# Patient Record
Sex: Female | Born: 1945 | Race: White | Hispanic: No | Marital: Married | Smoking: Never smoker
Health system: Southern US, Community
[De-identification: ages and names within clinical notes are randomized; demographics above are authoritative.]

## PROBLEM LIST (undated history)

## (undated) DIAGNOSIS — T7840XA Allergy, unspecified, initial encounter: Secondary | ICD-10-CM

## (undated) DIAGNOSIS — N390 Urinary tract infection, site not specified: Secondary | ICD-10-CM

## (undated) DIAGNOSIS — M797 Fibromyalgia: Secondary | ICD-10-CM

## (undated) DIAGNOSIS — F32A Depression, unspecified: Secondary | ICD-10-CM

## (undated) DIAGNOSIS — F329 Major depressive disorder, single episode, unspecified: Secondary | ICD-10-CM

## (undated) DIAGNOSIS — R569 Unspecified convulsions: Secondary | ICD-10-CM

## (undated) HISTORY — DX: Allergy, unspecified, initial encounter: T78.40XA

## (undated) HISTORY — PX: OVARIAN CYST REMOVAL: SHX89

## (undated) HISTORY — DX: Depression, unspecified: F32.A

## (undated) HISTORY — DX: Unspecified convulsions: R56.9

## (undated) HISTORY — PX: KYPHOSIS SURGERY: SHX114

## (undated) HISTORY — PX: ABDOMINAL HYSTERECTOMY: SHX81

## (undated) HISTORY — DX: Urinary tract infection, site not specified: N39.0

## (undated) HISTORY — PX: TUBAL LIGATION: SHX77

## (undated) HISTORY — DX: Major depressive disorder, single episode, unspecified: F32.9

## (undated) HISTORY — PX: NASAL SEPTUM SURGERY: SHX37

## (undated) HISTORY — PX: UPPER GASTROINTESTINAL ENDOSCOPY: SHX188

## (undated) HISTORY — DX: Fibromyalgia: M79.7

---

## 1998-06-19 DIAGNOSIS — R569 Unspecified convulsions: Secondary | ICD-10-CM

## 1998-06-19 HISTORY — DX: Unspecified convulsions: R56.9

## 1999-06-20 ENCOUNTER — Encounter: Payer: Self-pay | Admitting: Family Medicine

## 1999-06-20 LAB — CONVERTED CEMR LAB

## 2001-05-15 ENCOUNTER — Encounter: Admission: RE | Admit: 2001-05-15 | Discharge: 2001-05-15 | Payer: Self-pay | Admitting: *Deleted

## 2001-05-20 ENCOUNTER — Encounter: Admission: RE | Admit: 2001-05-20 | Discharge: 2001-05-20 | Payer: Self-pay | Admitting: *Deleted

## 2003-06-01 ENCOUNTER — Ambulatory Visit (HOSPITAL_COMMUNITY): Admission: RE | Admit: 2003-06-01 | Discharge: 2003-06-01 | Payer: Self-pay | Admitting: Neurosurgery

## 2003-07-02 ENCOUNTER — Ambulatory Visit (HOSPITAL_COMMUNITY): Admission: RE | Admit: 2003-07-02 | Discharge: 2003-07-02 | Payer: Self-pay | Admitting: Neurosurgery

## 2003-07-09 ENCOUNTER — Inpatient Hospital Stay (HOSPITAL_COMMUNITY): Admission: RE | Admit: 2003-07-09 | Discharge: 2003-07-12 | Payer: Self-pay | Admitting: Neurosurgery

## 2003-07-09 ENCOUNTER — Encounter (INDEPENDENT_AMBULATORY_CARE_PROVIDER_SITE_OTHER): Payer: Self-pay | Admitting: Specialist

## 2003-08-13 ENCOUNTER — Ambulatory Visit (HOSPITAL_COMMUNITY): Admission: RE | Admit: 2003-08-13 | Discharge: 2003-08-13 | Payer: Self-pay | Admitting: Neurosurgery

## 2003-09-04 ENCOUNTER — Inpatient Hospital Stay (HOSPITAL_COMMUNITY): Admission: RE | Admit: 2003-09-04 | Discharge: 2003-09-05 | Payer: Self-pay | Admitting: Neurosurgery

## 2004-04-26 ENCOUNTER — Ambulatory Visit: Payer: Self-pay | Admitting: Family Medicine

## 2005-04-13 ENCOUNTER — Ambulatory Visit: Payer: Self-pay | Admitting: Family Medicine

## 2005-04-20 ENCOUNTER — Ambulatory Visit: Payer: Self-pay | Admitting: Family Medicine

## 2007-02-27 ENCOUNTER — Encounter: Payer: Self-pay | Admitting: Family Medicine

## 2007-02-27 DIAGNOSIS — F329 Major depressive disorder, single episode, unspecified: Secondary | ICD-10-CM

## 2007-02-27 DIAGNOSIS — N390 Urinary tract infection, site not specified: Secondary | ICD-10-CM | POA: Insufficient documentation

## 2007-02-27 DIAGNOSIS — Z8669 Personal history of other diseases of the nervous system and sense organs: Secondary | ICD-10-CM | POA: Insufficient documentation

## 2007-02-27 DIAGNOSIS — IMO0001 Reserved for inherently not codable concepts without codable children: Secondary | ICD-10-CM | POA: Insufficient documentation

## 2007-02-27 DIAGNOSIS — G43909 Migraine, unspecified, not intractable, without status migrainosus: Secondary | ICD-10-CM | POA: Insufficient documentation

## 2009-07-15 ENCOUNTER — Encounter: Admission: RE | Admit: 2009-07-15 | Discharge: 2009-07-15 | Payer: Self-pay | Admitting: Anesthesiology

## 2009-07-21 ENCOUNTER — Encounter: Admission: RE | Admit: 2009-07-21 | Discharge: 2009-07-21 | Payer: Self-pay | Admitting: Anesthesiology

## 2010-11-04 NOTE — Discharge Summary (Signed)
NAME:  Amy Vargas, KINSEL                        ACCOUNT NO.:  [id number]   MEDICAL RECORD NO.:  [id number]                   PATIENT TYPE:  INP   LOCATION:  3009                                 FACILITY:  [facility]   PHYSICIAN:  Hewitt Shorts, M.D.            DATE OF BIRTH:  Feb 01, 1946   DATE OF ADMISSION:  07/09/2003  DATE OF DISCHARGE:  07/12/2003                                 DISCHARGE SUMMARY   HISTORY OF PRESENT ILLNESS:  The patient is a 65 year old woman patient of  Dr. Fredrich Birks, whom he evaluated for multiple compression fractures at T11,  T12, and L4.  He elected to admit the patient for kyphoplasty, and details  of her history and admission physical examination are included in Dr.  Fredrich Birks admission note.   HOSPITAL COURSE:  The patient was admitted, underwent the T11, T12, and L4  kyphoplasty with bone biopsy.  Following surgery, she did well other than  for urinary retention.  She required in-and-out catheterizations for several  days; however, she began to void on her own.  We did check a postvoid  residual which was 50 mL.  She also had difficulties with migraines that she  treated with her usual medications, including Soma and Wellbutrin and, at  this time, she is asking to be discharged to home.  She is afebrile.  She is  to return to see Dr. Venetia Maxon in 2 weeks with x-rays of her back to be done  that day.  She was given a prescription for Percocet 1-2 tabs p.o. q.4-6h.  p.r.n. pain, 40 tabs prescribed, no refills.   DISCHARGE DIAGNOSES:  T11, T12, and L4 pathologic fractures with intractable  back pain.                                                Hewitt Shorts, M.D.    RWN/MEDQ  D:  07/12/2003  T:  07/12/2003  Job:  [id number]

## 2010-11-04 NOTE — Op Note (Signed)
NAME:  Amy Vargas, Amy Vargas                        ACCOUNT NO.:  [id number]   MEDICAL RECORD NO.:  [id number]                   PATIENT TYPE:  INP   LOCATION:  3039                                 FACILITY:  [facility]   PHYSICIAN:  Danae Orleans. Venetia Maxon, M.D.               DATE OF BIRTH:  1946-04-28   DATE OF PROCEDURE:  09/04/2003  DATE OF DISCHARGE:  09/05/2003                                 OPERATIVE REPORT   PREOPERATIVE DIAGNOSIS:  L1 compression fracture with osteoporosis.   POSTOPERATIVE DIAGNOSIS:  L1 compression fracture with osteoporosis.   PROCEDURE:  L1 kyphoplasty with fluoroscopic interpretation.   SURGEON:  Danae Orleans. Venetia Maxon, M.D.   ANESTHESIA:  General endotracheal anesthesia.   ESTIMATED BLOOD LOSS:  Minimal.   COMPLICATIONS:  None.   DISPOSITION:  To recovery.   INDICATIONS:  [name] is a 65 year old woman with osteoporosis with  multiple vertebral compression fractures.  She had previous kyphoplasty of  T11 and T12 fractures and L4 fracture.  She then fell on some grease and  landed on her buttock and has a new compression fracture of L1, and this is  exquisitely painful to her.  It was elected to take her to surgery for  kyphoplasty of the L1 fracture.   DESCRIPTION OF PROCEDURE:  Ms. Poss was brought to the operating room.  Following the satisfactory and uncomplicated induction of general  endotracheal anesthesia and placement of intravenous lines, the patient was  turned into a prone position on chest rolls.  Her back was then prepped and  draped in the usual sterile fashion.  AP and lateral fluoroscopy were  positioned over the L1 vertebral body on the left side of the midline  approximately 1.5 cm off the lateral edge of the pedicle.  The subcutaneous  tissues were infiltrated with 0.25% Marcaine and 0.5% lidocaine with  1:200,000 epinephrine.  Incision was made in this location and a one-step  introducer was then inserted into the lateral edge of the L1  pedicle and  driven in a transpedicular approach toward the anterior aspect of the L1  vertebra in the midline.  After confirmation of positioning on the AP and  lateral fluoroscopy, the kyphoplasty balloon was inserted and using the  inflatable bone tamp, pressures were approximately 180 at the most,  typically about 130.  There was good restoration of vertebral body height  and restoration of superior end plate fracture.  No cut-outs or  extravasation of dye.  The bone cement was then mixed and the balloon was  deflated.  The cavity was then filled with 4.5 mL of Kyphon bone cement with  good interdigitation, two-thirds body fill with mediolateral and anterior to  posterior fill.  It was felt that this was adequate filling.  The introducer  was then removed.  A single stitch  was placed and the wound was dressed with Dermabond.  The patient was  extubated in  the operating room and taken to the recovery room in stable and  satisfactory condition, having tolerated her operation well.  Counts were  correct at the end of the case.                                               Danae Orleans. Venetia Maxon, M.D.    JDS/MEDQ  D:  09/04/2003  T:  09/07/2003  Job:  [id number]

## 2010-11-04 NOTE — Op Note (Signed)
NAME:  Amy Vargas, Amy Vargas                        ACCOUNT NO.:  [id number]   MEDICAL RECORD NO.:  [id number]                   PATIENT TYPE:  INP   LOCATION:  3009                                 FACILITY:  [facility]   PHYSICIAN:  Danae Orleans. Venetia Maxon, M.D.               DATE OF BIRTH:  1945/08/03   DATE OF PROCEDURE:  07/09/2003  DATE OF DISCHARGE:                                 OPERATIVE REPORT   PREOPERATIVE DIAGNOSIS:  T11, T12, and L4 pathologic fractures with severe  intractable back pain.   POSTOPERATIVE DIAGNOSIS:  T11, T12, and L4 pathologic fractures with severe intractable back pain.   PROCEDURE:  T11, T12, and L4 kyphoplasty with open reduction and internal  fixation of pathologic fractures with inflatable bone taps and methyl  methacrylate bone cement with bone biopsy at each level.   SURGEON:  Danae Orleans. Venetia Maxon, M.D.   ANESTHESIA:  General endotracheal anesthesia.   ESTIMATED BLOOD LOSS:  Minimal.   COMPLICATIONS:  None.   DISPOSITION:  Recovery room.   INDICATIONS FOR PROCEDURE:  [name] is a 65 year old woman with  severe, intractable pain following an L4 fracture.  She was scheduled for an  L4 kyphoplasty and then fell again and fractured T11 and T12 levels and is  having severe pain at these levels, as well.  An MRI demonstrated high  signal in the T11 and T12 vertebra and it was elected to include these at  the time of surgical procedure.   PROCEDURE:  Ms. Millirons was brought to the operating room.  Following  satisfactory uncomplicated induction of general endotracheal anesthesia and  placement of intravenous  lines, the patient was turned in a prone position  on chest and iliac crest rolls.  Her AP and lateral fluoroscopy was  established.  Using the kyphoplasty one step introducer system, inflatable  bone taps were placed at the T11, T12, and L4 levels.  There was good  restoration of vertebral body height.  No violations of the lateral cortex  with  balloon inflation and bone biopsies were obtained at each level.  The  patient does not have a history of osteoporosis but had her last bone  density study in 1996.  The bone cement was then mixed appropriately and  after it had arrived at the appropriate consistency, it was placed in the  fracture level to introducers of bone cement on each side at T11 and T12 and  also at the L4 level which was operated on separately.  Initially, the T11  and T12 levels were operated and then C-arms were moved to L4 and this was  operated on after that.  There was good restoration of vertebral body  height.  No evidence of violation or extravasation of bone cement other than  a very small punctate area at the superior aspect of the L4 endplate.  The  introducers were then removed.  3-0 Vicryl sutures were  placed at each of  the separate stab incisions and Dermabond was placed for wound dressing.  The patient tolerated the procedure well.  Counts were correct at the end of  the case.                                               Danae Orleans. Venetia Maxon, M.D.    JDS/MEDQ  D:  07/09/2003  T:  07/09/2003  Job:  [id number]

## 2010-12-27 ENCOUNTER — Encounter: Payer: Self-pay | Admitting: Family Medicine

## 2011-04-28 ENCOUNTER — Encounter: Payer: Self-pay | Admitting: Family Medicine

## 2011-05-01 ENCOUNTER — Ambulatory Visit (INDEPENDENT_AMBULATORY_CARE_PROVIDER_SITE_OTHER): Payer: Medicare Other | Admitting: Family Medicine

## 2011-05-01 ENCOUNTER — Encounter: Payer: Self-pay | Admitting: Family Medicine

## 2011-05-01 DIAGNOSIS — F329 Major depressive disorder, single episode, unspecified: Secondary | ICD-10-CM

## 2011-05-01 DIAGNOSIS — M810 Age-related osteoporosis without current pathological fracture: Secondary | ICD-10-CM | POA: Insufficient documentation

## 2011-05-01 DIAGNOSIS — F3289 Other specified depressive episodes: Secondary | ICD-10-CM

## 2011-05-01 DIAGNOSIS — Z1211 Encounter for screening for malignant neoplasm of colon: Secondary | ICD-10-CM

## 2011-05-01 DIAGNOSIS — Z79899 Other long term (current) drug therapy: Secondary | ICD-10-CM

## 2011-05-01 DIAGNOSIS — IMO0001 Reserved for inherently not codable concepts without codable children: Secondary | ICD-10-CM

## 2011-05-01 DIAGNOSIS — Z0000 Encounter for general adult medical examination without abnormal findings: Secondary | ICD-10-CM

## 2011-05-01 DIAGNOSIS — R569 Unspecified convulsions: Secondary | ICD-10-CM

## 2011-05-01 NOTE — Patient Instructions (Signed)
Schedule your physical at your convenience- you can eat since we'll have the labs done Call and ask your insurance about the shingles shot We'll do the Pneumonia shot at the next visit We'll notify you of your lab results Someone will call with your bone density and GI referrals Call with any questions or concerns Welcome!  We're glad to have you!!!

## 2011-05-01 NOTE — Progress Notes (Signed)
  Subjective:    Patient ID: Amy Vargas, female    DOB: 1946-01-01, 65 y.o.   MRN: [id number]  HPI New to establish.  Previous MD- Scotty Court.  Seizure Disorder- chronic problem, has been seizure free 'for a long long time'.  Not currently on meds.  No longer following w/ neuro.  Fibromyalgia- chronic problem, sees Dr Vear Clock (Pain Management)  Osteoporosis- chronic problem for pt, was previously on Forteo.  Has multiple vertebral fractures, s/p kyphoplasty x2.  Not currently on any meds.  Has been taking 3 Calcium pills daily.  Tried Fosamax but this caused GI problems.  Has never heard of Reclast.  Depression- chronic problem, on Wellbutrin, take 2 tabs twice daily.  Depression is related to physical pain.  Has previously done counseling.  Denies SI/HI.  Not crying frequently, feels like sxs are well controlled.  Health maintenance- UTD on mammo, had colonoscopy done in 2001- overdue.  Due for labs, DEXA, colonoscopy.  Review of Systems For ROS see HPI     Objective:   Physical Exam  Vitals reviewed. Constitutional: She is oriented to person, place, and time. She appears well-developed and well-nourished. No distress.  HENT:  Head: Normocephalic and atraumatic.  Eyes: Conjunctivae and EOM are normal. Pupils are equal, round, and reactive to light.  Neck: Normal range of motion. Neck supple. No thyromegaly present.  Cardiovascular: Normal rate, regular rhythm, normal heart sounds and intact distal pulses.   No murmur heard. Pulmonary/Chest: Effort normal and breath sounds normal. No respiratory distress.  Abdominal: Soft. She exhibits no distension. There is no tenderness.  Musculoskeletal: She exhibits no edema.  Lymphadenopathy:    She has no cervical adenopathy.  Neurological: She is alert and oriented to person, place, and time.  Skin: Skin is warm and dry.  Psychiatric: She has a normal mood and affect. Her behavior is normal.          Assessment & Plan:

## 2011-05-02 LAB — CBC WITH DIFFERENTIAL/PLATELET
Eosinophils Absolute: 0.1 10*3/uL (ref 0.0–0.7)
Lymphocytes Relative: 13.3 % (ref 12.0–46.0)
MCHC: 33.4 g/dL (ref 30.0–36.0)
MCV: 88.3 fl (ref 78.0–100.0)
Monocytes Absolute: 0.1 10*3/uL (ref 0.1–1.0)
Neutrophils Relative %: 83.2 % — ABNORMAL HIGH (ref 43.0–77.0)
Platelets: 266 10*3/uL (ref 150.0–400.0)
WBC: 7 10*3/uL (ref 4.5–10.5)

## 2011-05-02 LAB — HEPATIC FUNCTION PANEL
AST: 22 U/L (ref 0–37)
Alkaline Phosphatase: 61 U/L (ref 39–117)
Bilirubin, Direct: 0.1 mg/dL (ref 0.0–0.3)
Total Bilirubin: 0.5 mg/dL (ref 0.3–1.2)

## 2011-05-02 LAB — BASIC METABOLIC PANEL
GFR: 59.06 mL/min — ABNORMAL LOW (ref >60.00)
Potassium: 4 mEq/L (ref 3.5–5.1)
Sodium: 143 mEq/L (ref 135–145)

## 2011-05-02 LAB — LIPID PANEL
HDL: 82.9 mg/dL (ref >39.00)
Total CHOL/HDL Ratio: 3
VLDL: 15.6 mg/dL (ref 0.0–40.0)

## 2011-05-03 ENCOUNTER — Encounter: Payer: Self-pay | Admitting: *Deleted

## 2011-05-03 ENCOUNTER — Encounter: Payer: Self-pay | Admitting: Gastroenterology

## 2011-05-04 LAB — VITAMIN D 1,25 DIHYDROXY
Vitamin D 1, 25 (OH)2 Total: 51 pg/mL (ref 18–72)
Vitamin D3 1, 25 (OH)2: 51 pg/mL

## 2011-05-05 ENCOUNTER — Encounter: Payer: Self-pay | Admitting: *Deleted

## 2011-05-07 ENCOUNTER — Encounter: Payer: Self-pay | Admitting: Family Medicine

## 2011-05-07 NOTE — Assessment & Plan Note (Signed)
Chronic problem.  Follows w/ pain management.  On chronic narcotics.  Will not prescribe additional controlled substances due to contract.

## 2011-05-07 NOTE — Assessment & Plan Note (Signed)
Chronic problem for pt.  Previously on Forteo.  Not currently.  Hx of multiple vertebral fxs.  Overdue on DEXA.  May be good candidate for Reclast.

## 2011-05-07 NOTE — Assessment & Plan Note (Signed)
Pt has hx of seziures but has been seizure free 'for years' w/out medication.  No longer seeing neuro.  Will follow.

## 2011-05-07 NOTE — Assessment & Plan Note (Signed)
Chronic problem for pt.  Feels sxs are well controlled on Wellbutrin.  Will continue to follow.

## 2011-05-08 ENCOUNTER — Telehealth: Payer: Self-pay | Admitting: *Deleted

## 2011-05-08 NOTE — Telephone Encounter (Signed)
Manually faxed benefit verification form to reclast at [phone]

## 2011-05-19 ENCOUNTER — Encounter: Payer: Self-pay | Admitting: Gastroenterology

## 2011-05-19 ENCOUNTER — Ambulatory Visit (AMBULATORY_SURGERY_CENTER): Payer: Medicare Other | Admitting: *Deleted

## 2011-05-19 ENCOUNTER — Telehealth: Payer: Self-pay | Admitting: *Deleted

## 2011-05-19 VITALS — Ht 63.0 in | Wt 119.5 lb

## 2011-05-19 DIAGNOSIS — Z1211 Encounter for screening for malignant neoplasm of colon: Secondary | ICD-10-CM

## 2011-05-19 MED ORDER — PEG-KCL-NACL-NASULF-NA ASC-C 100 G PO SOLR: ORAL | Status: DC

## 2011-05-19 NOTE — Telephone Encounter (Signed)
Called [employer] to ask about the verifaction for this pt per had called on 05-10-11 and was advised that the fax was never received. Per originally faxed on 05-08-11. Received information about another pt that was faxed same day. Called and spoke to another rep for reclast to ask for update. Was advised that we received a fax on 05-17-11 to state the pt dx code was not check on app. Did not find a fax with this information. Did not the dx code was left off form. Did check code 733.01 and refaxed on 05-19-11

## 2011-05-23 ENCOUNTER — Telehealth: Payer: Self-pay | Admitting: *Deleted

## 2011-05-23 NOTE — Telephone Encounter (Signed)
Called [employer] to confirm they are working on reclast for pt per last fax/phone call stated the dx code was missing from pt form Rep advised that the dx code fax has been received and pt is currently being verified by their company for reclast reclast rep advised that when the verifcation comes through we will be faxed information of benefits. Confirmed our fax number with rep

## 2011-06-02 ENCOUNTER — Ambulatory Visit (AMBULATORY_SURGERY_CENTER): Payer: Medicare Other | Admitting: Gastroenterology

## 2011-06-02 ENCOUNTER — Encounter: Payer: Self-pay | Admitting: Gastroenterology

## 2011-06-02 VITALS — BP 134/82 | HR 98 | Temp 98.0°F | Resp 16 | Ht 63.0 in | Wt 119.0 lb

## 2011-06-02 DIAGNOSIS — D126 Benign neoplasm of colon, unspecified: Secondary | ICD-10-CM

## 2011-06-02 DIAGNOSIS — Z1211 Encounter for screening for malignant neoplasm of colon: Secondary | ICD-10-CM

## 2011-06-02 MED ORDER — SODIUM CHLORIDE 0.9 % IV SOLN: 500.0000 mL | INTRAVENOUS | Status: DC

## 2011-06-02 NOTE — Patient Instructions (Signed)
Please follow all discharge instructions given to you by the recovery room nurse. If you have any questions or problems after discharge please call [phone]. You will receive a phone call in the am to see how you are doing and answer any questions you may have. Thank you for choosing [facility] for your health care needs.  

## 2011-06-02 NOTE — Progress Notes (Signed)
Patient did not experience any of the following events: a burn prior to discharge; a fall within the facility; wrong site/side/patient/procedure/implant event; or a hospital transfer or hospital admission upon discharge from the facility. (G8907) Patient did not have preoperative order for IV antibiotic SSI prophylaxis. (G8918)  

## 2011-06-02 NOTE — Op Note (Signed)
[facility] [address] [place], [place]  [address]  COLONOSCOPY PROCEDURE REPORT  PATIENT:  Amy, Vargas  MR#:  [id number] BIRTHDATE:  January 06, 1946, 65 yrs. old  GENDER:  female ENDOSCOPIST:  Rachael Fee, MD REF. BY:  Neena Rhymes, M.D. PROCEDURE DATE:  06/02/2011 PROCEDURE:  Colonoscopy with snare polypectomy ASA CLASS:  Class II INDICATIONS:  Routine Risk Screening MEDICATIONS:   Fentanyl 50 mcg IV, Benadryl 12.5 mg IV, These medications were titrated to patient response per physician's verbal order, Versed 8 mg IV  DESCRIPTION OF PROCEDURE:   After the risks benefits and alternatives of the procedure were thoroughly explained, informed consent was obtained.  Digital rectal exam was performed and revealed no rectal masses.   The LB [id number]-[id number] endoscope was introduced through the anus and advanced to the cecum, which was identified by both the appendix and ileocecal valve, without limitations.  The quality of the prep was adequate..  The instrument was then slowly withdrawn as the colon was fully examined.<<PROCEDUREIMAGES>> FINDINGS:  Three polyps were found. All were removed and all sent to pathology. Two were sessile, 3 to 7mm across, removed with cold snare, ascending and trasnverse segments, path jar 1. One was raised, soft, 1.9cm across, removed completely (without piecemeal) from hepatic flexure with snare/cautery, path jar 2 (see image5, image6, image7, and image12).  This was otherwise a normal examination of the colon (see image13, image3, and image2). Retroflexed views in the rectum revealed no abnormalities. COMPLICATIONS:  None  ENDOSCOPIC IMPRESSION: 1) Three polyps (one was 1.9cm), all were removed and all sent to pathology 2) Otherwise normal examination  RECOMMENDATIONS: 1) If the polyps removed today are proven to be adenomatous (pre-cancerous) polyps, you will need a colonoscopy in 3 years. Otherwise you should continue to  follow colorectal cancer screening guidelines for "routine risk" patients with a colonoscopy in 10 years. 2) You will receive a letter within 1-2 weeks with the results of your biopsy as well as final recommendations. Please call my office if you have not received a letter after 3 weeks.  ______________________________ Rachael Fee, MD  n. eSIGNED:   Rachael Fee at 06/02/2011 11:09 AM  Myriam Forehand, [phone]

## 2011-06-05 ENCOUNTER — Telehealth: Payer: Self-pay | Admitting: *Deleted

## 2011-06-05 NOTE — Telephone Encounter (Signed)

## 2011-06-14 ENCOUNTER — Telehealth: Payer: Self-pay | Admitting: *Deleted

## 2011-06-14 NOTE — Telephone Encounter (Signed)
Called pt to advise reclast benefits of a 20% co-pay pt not sure how much this will be and wants to call insurance company to see the exact amount and will call us back if and when she wants to do the reclast, advised if I do not hear from her I will call her back to check in on the progress, pt agreed

## 2011-06-20 HISTORY — PX: EYE SURGERY: SHX253

## 2011-06-21 ENCOUNTER — Telehealth: Payer: Self-pay | Admitting: *Deleted

## 2011-06-21 ENCOUNTER — Ambulatory Visit (INDEPENDENT_AMBULATORY_CARE_PROVIDER_SITE_OTHER): Payer: Medicare Other | Admitting: Family Medicine

## 2011-06-21 ENCOUNTER — Encounter: Payer: Self-pay | Admitting: Family Medicine

## 2011-06-21 DIAGNOSIS — Z0000 Encounter for general adult medical examination without abnormal findings: Secondary | ICD-10-CM

## 2011-06-21 DIAGNOSIS — M810 Age-related osteoporosis without current pathological fracture: Secondary | ICD-10-CM

## 2011-06-21 DIAGNOSIS — Z23 Encounter for immunization: Secondary | ICD-10-CM

## 2011-06-21 MED ORDER — ZOSTER VACCINE LIVE 19400 UNT/0.65ML ~~LOC~~ SOLR: 0.6500 mL | Freq: Once | SUBCUTANEOUS | Status: AC

## 2011-06-21 NOTE — Telephone Encounter (Signed)
Pt came in for OV and advised that hospital nor insurance company can tell her how much her reclast will cost her out of pocket, pt unable to get an answer and does want the reclast, I contacted the [employer] Stay and was advised that they only set up the appts and was given several numbers to contact, called admitting and financial dept and was advised no answer could be given in this dept called pt accounting and noted a vm option only, left vm to call office about pt question. MD aware verbally

## 2011-06-21 NOTE — Progress Notes (Signed)
  Subjective:    Patient ID: Amy Vargas, female    DOB: [birth year], 66 y.o.   MRN: [id number]  HPI Here today for CPE.  Risk Factors: Osteoporosis- is attempting to determine insurance coverage on Reclast.  Physical Activity: walking regularly Fall Risk: is unsteady on feet due to multiple pain meds Depression: chronic problem, well controlled on current meds. Hearing: normal to conversational tones, somewhat decreased to whispered voice ADL's: independent Cognitive: normal linear thought process, memory and attention intact Home Safety: safe at home Height, Weight, BMI, Visual Acuity: see vitals, vision corrected to 20/20 w/ glasses Counseling: UTD on colonoscopy, mammo, no need for pap due to TAH-BSO.  Due for shingles and pneumovax Labs Ordered: See A&P Care Plan: See A&P    Review of Systems Patient reports no vision/ hearing changes, adenopathy,fever, weight change,  persistant/recurrent hoarseness , swallowing issues, chest pain, palpitations, edema, persistant/recurrent cough, hemoptysis, dyspnea (rest/exertional/paroxysmal nocturnal), gastrointestinal bleeding (melena, rectal bleeding), abdominal pain, significant heartburn, bowel changes, GU symptoms (dysuria, hematuria, incontinence), Gyn symptoms (abnormal  bleeding, pain),  syncope, focal weakness, memory loss, skin/hair/nail changes, abnormal bruising or bleeding.   Intermittent tingling of feet, + depression, intermittent N/V    Objective:   Physical Exam General Appearance:    Alert, cooperative, no distress, appears stated age  Head:    Normocephalic, without obvious abnormality, atraumatic  Eyes:    PERRL, conjunctiva/corneas clear, EOM's intact, fundi    benign, both eyes  Ears:    Normal TM's and external ear canals, both ears  Nose:   Nares normal, septum midline, mucosa normal, no drainage    or sinus tenderness  Throat:   Lips, mucosa, and tongue normal; teeth and gums normal  Neck:   Supple,  symmetrical, trachea midline, no adenopathy;    Thyroid: no enlargement/tenderness/nodules  Back:     Symmetric, no curvature, ROM normal, no CVA tenderness  Lungs:     Clear to auscultation bilaterally, respirations unlabored  Chest Wall:    No tenderness or deformity   Heart:    Regular rate and rhythm, S1 and S2 normal, no murmur, rub   or gallop  Breast Exam:    Deferred at pt's request  Abdomen:     Soft, non-tender, bowel sounds active all four quadrants,    no masses, no organomegaly  Genitalia:    Deferred at pt's request  Rectal:    Extremities:   Extremities normal, atraumatic, no cyanosis or edema  Pulses:   2+ and symmetric all extremities  Skin:   Skin color, texture, turgor normal, no rashes or lesions  Lymph nodes:   Cervical, supraclavicular, and axillary nodes normal  Neurologic:   CNII-XII intact, normal strength, sensation and reflexes    throughout          Assessment & Plan:

## 2011-06-21 NOTE — Patient Instructions (Signed)
Follow up in 1 year or as needed Go get your shingles shot at the pharmacy Call your insurance company and tell them that you would have your Reclast infusion at [facility] and you need to know the price Your labs look great! Call with any questions or concerns Happy New Year!

## 2011-07-02 NOTE — Assessment & Plan Note (Signed)
Pt's PE WNL.  UTD on health maintenance.  Reviewed labs from last visit.  EKG done- see document for interpretation.  Anticipatory guidance provided.

## 2011-07-02 NOTE — Assessment & Plan Note (Signed)
Pt is trying to determine insurance coverage for Reclast prior to proceeding.  Encouraged her to call and ask for price.  Pt to keep me updated on when she would like to have infusion.

## 2011-09-20 ENCOUNTER — Telehealth: Payer: Self-pay | Admitting: *Deleted

## 2011-09-20 NOTE — Telephone Encounter (Signed)
Called pt to advise results of bone density scan results as Osteoporosis, pt needs to start Fosamax 75mg  weekly, and continue taking the ca+ and Vit d, pt advised that she can not take oral medication per this upsets her stomach, pt also noted that she can not afford the reclast per her insurance will no longer pay for it, pt notes unable to pay for it per her money is tight per her sister died 03/09/2024apologized for the passing of her sister, noted that I will tell MD Beverely Low to see if any other alternatives can be advised, pt understood

## 2011-09-20 NOTE — Telephone Encounter (Signed)
Reclast is our best option- this is typically covered at 80% by Medicare but I can't tell her how much her payment would be.  At this time, it would be best to ensure 1200 Ca and 800 Vit D daily.  i'm sorry for the loss of her sister.

## 2011-09-20 NOTE — Telephone Encounter (Signed)
Noted waiver in pt chart signed to allow detailed messages to be left on voicemail, left detailed message about: results/instructions/prescribtion information. Advise if any further concerns or questions please call our office at [phone]. Also advised MD Beverely Low sorry about the loss of her sister

## 2011-09-26 ENCOUNTER — Encounter: Payer: Self-pay | Admitting: Family Medicine

## 2011-10-31 ENCOUNTER — Telehealth: Payer: Self-pay | Admitting: *Deleted

## 2011-10-31 NOTE — Telephone Encounter (Signed)
Called pt to update about her reclast per pt and this nurse noted inability to find out pt out of pocket cost, noted that a similar pt came into office with the same insurance and was given an exact amount of pay out, advised to call her insurance to see if they have an update about her personal amount out of pocket, pt understand and will contact insurance company to see if they can now give her an out of pocket amount. MD Beverely Low made aware verbally

## 2011-11-24 ENCOUNTER — Ambulatory Visit (INDEPENDENT_AMBULATORY_CARE_PROVIDER_SITE_OTHER): Payer: Medicare Other | Admitting: Family Medicine

## 2011-11-24 ENCOUNTER — Encounter: Payer: Self-pay | Admitting: Family Medicine

## 2011-11-24 VITALS — BP 109/72 | HR 100 | Temp 98.3°F | Ht 63.5 in | Wt 109.0 lb

## 2011-11-24 DIAGNOSIS — R634 Abnormal weight loss: Secondary | ICD-10-CM

## 2011-11-24 DIAGNOSIS — J329 Chronic sinusitis, unspecified: Secondary | ICD-10-CM

## 2011-11-24 MED ORDER — AMOXICILLIN 500 MG PO TABS: 500.0000 mg | ORAL_TABLET | Freq: Two times a day (BID) | ORAL | Status: AC

## 2011-11-24 NOTE — Patient Instructions (Addendum)
Follow up in 1 month to recheck weight This is a sinus infection Start the Amoxicillin twice daily- take w/ food Make sure you are eating regularly If not interested in eating, make sure you drink Boost or Ensure to get some protein Call with any questions or concerns Hang in there!

## 2011-11-24 NOTE — Progress Notes (Signed)
  Subjective:    Patient ID: Amy Vargas, female    DOB: 30-Apr-1946, 66 y.o.   MRN: [id number]  HPI Sore throat- reports throat is swollen, very sore.  sxs started 1 week ago.  Intermittent fevers and chills.  Tm 101.  + nasal congestion.  No facial pain, ear pain.  + nausea.  No vomiting.  No known sick contacts.  No rash.  Hx of seasonal allergies.  No cough.  + hoarseness.  Has lost 13 lbs since last visit- 'everything tastes like cardboard'.  Since getting sick has been drinking fluids and eating liquid diet.   Review of Systems For ROS see HPI     Objective:   Physical Exam  Vitals reviewed. Constitutional: She is oriented to person, place, and time. No distress.       Very thin, frail appearing  HENT:  Head: Normocephalic and atraumatic.  Right Ear: Tympanic membrane normal.  Left Ear: Tympanic membrane normal.  Nose: Mucosal edema and rhinorrhea present. Right sinus exhibits maxillary sinus tenderness and frontal sinus tenderness. Left sinus exhibits maxillary sinus tenderness and frontal sinus tenderness.  Mouth/Throat: Uvula is midline and mucous membranes are normal. Posterior oropharyngeal erythema present. No oropharyngeal exudate.  Eyes: Conjunctivae and EOM are normal. Pupils are equal, round, and reactive to light.  Neck: Normal range of motion. Neck supple.  Cardiovascular: Normal rate, regular rhythm and normal heart sounds.   Pulmonary/Chest: Effort normal and breath sounds normal. No respiratory distress. She has no wheezes.  Abdominal: Soft. Bowel sounds are normal. She exhibits no distension. There is no tenderness. There is no rebound.  Lymphadenopathy:    She has no cervical adenopathy.  Neurological: She is alert and oriented to person, place, and time.  Skin: Skin is warm and dry.  Psychiatric:       Flat affect, withdrawn          Assessment & Plan:

## 2011-12-10 DIAGNOSIS — R634 Abnormal weight loss: Secondary | ICD-10-CM | POA: Insufficient documentation

## 2011-12-10 NOTE — Assessment & Plan Note (Signed)
New.  Pt has lost 13 lbs unintentionally since last visit.  Is flat, withdrawn.  Questioned her if depression was worse- pt 'can't say'.  Encouraged her to add high calorie protein supplement to current 'liquid diet'.  Will follow closely.

## 2011-12-10 NOTE — Assessment & Plan Note (Signed)
New.  Pt's sxs and PE consistent w/ infxn.  Start abx.  Reviewed supportive care and red flags that should prompt return.  Pt expressed understanding and is in agreement w/ plan.  

## 2012-04-16 ENCOUNTER — Telehealth: Payer: Self-pay | Admitting: *Deleted

## 2012-04-16 NOTE — Telephone Encounter (Signed)
Noted.  Thank you for reaching out to pt- will see her when she is able to return

## 2012-04-16 NOTE — Telephone Encounter (Signed)
Called pt to follow up from 11-24-11 OV, pt was in for multiple concerns with fatigue and weight loss noted, pt was to come back in one month for a follow up and pt has not been seen in office since that OV, pt stated she has not made an apt per new concerns in her life, noted her grandson recently moved in with her and she is afraid to make an apt at this time for fear of having to change apt per new living arrangements, pt also noted billing issue with insurance concerning colonoscopy and another test from last CPE, noted she has spoken with billing dept and insurance about the issue and they are currently working on a resolve, pt advised that she has noted fluctuation in her weight but nothing that is concerning to her at this time however plans to call and schedule an apt asap, pt noted that she did finally decide not to go through with the reclast per all the run around with the insurance and too many other billing issues with testing the insurance will not pay, please note

## 2012-06-05 ENCOUNTER — Ambulatory Visit (INDEPENDENT_AMBULATORY_CARE_PROVIDER_SITE_OTHER): Payer: Medicare Other | Admitting: Family Medicine

## 2012-06-05 ENCOUNTER — Encounter: Payer: Self-pay | Admitting: Family Medicine

## 2012-06-05 VITALS — BP 100/58 | HR 94 | Temp 97.3°F | Ht 63.5 in | Wt 97.6 lb

## 2012-06-05 DIAGNOSIS — E872 Acidosis: Secondary | ICD-10-CM

## 2012-06-05 DIAGNOSIS — E876 Hypokalemia: Secondary | ICD-10-CM

## 2012-06-05 LAB — BASIC METABOLIC PANEL
CO2: 30 mEq/L (ref 19–32)
Chloride: 100 mEq/L (ref 96–112)
Glucose, Bld: 84 mg/dL (ref 70–99)
Sodium: 139 mEq/L (ref 135–145)

## 2012-06-05 NOTE — Progress Notes (Signed)
  Subjective:    Patient ID: Amy Vargas, female    DOB: 11/17/1945, 66 y.o.   MRN: [id number]  HPI Hospital f/u- pt reports she ate a handful of tobasco potato chips and w/in hours was unable to hold anything down.  Severe abdominal pain, N/V, 911 was called.  Went to [facility].  Was admitted for 2-3 days.  Was started on Protonix in the hospital but fears this due to current osteoporosis.  Currently on Ranitidine 150mg  BID w/ good sxs control.  Pt's d/c summary indicated metabolic acidosis due to narcotic use.  Pt indicates she did not take more than usual- currently seeing pain management.  Pt reports feeling well now.  Denies N/V/D currently.  No abd pain, fevers, chills.   Review of Systems For ROS see HPI     Objective:   Physical Exam  Vitals reviewed. Constitutional: She is oriented to person, place, and time. No distress.       Pt very thin, frail appearing  HENT:  Head: Normocephalic and atraumatic.  Eyes: Conjunctivae normal and EOM are normal. Pupils are equal, round, and reactive to light.  Neck: Normal range of motion. Neck supple. No thyromegaly present.  Cardiovascular: Normal rate, regular rhythm, normal heart sounds and intact distal pulses.   Pulmonary/Chest: Effort normal and breath sounds normal. No respiratory distress. She has no wheezes. She has no rales.  Abdominal: Soft. Bowel sounds are normal. She exhibits no distension. There is no tenderness. There is no rebound and no guarding.  Musculoskeletal: She exhibits no edema.  Lymphadenopathy:    She has no cervical adenopathy.  Neurological: She is alert and oriented to person, place, and time.  Skin: Skin is warm and dry.  Psychiatric: She has a normal mood and affect. Her behavior is normal.          Assessment & Plan:

## 2012-06-05 NOTE — Patient Instructions (Addendum)
Schedule your complete physical at your convenience We'll notify you of your lab results No need for the Protonix Call with any questions or concerns Happy Holidays!!!

## 2012-06-13 ENCOUNTER — Encounter: Payer: Self-pay | Admitting: *Deleted

## 2012-06-16 NOTE — Assessment & Plan Note (Signed)
New.  There is some discrepancy between hospital d/c summary and pt's recollection of events.  D/c summary indicated unresponsiveness due to narcotic OD- pt reports this was not the case.  Pt denies taking meds any differently than usual or in excess of what's prescribed by pain management.  Pt well appearing today, check labs.  Encouraged her to discuss recent episode w/ pain management and determine whether meds need to be adjusted.

## 2012-06-16 NOTE — Assessment & Plan Note (Signed)
New.  Likely due to pt's recent vomiting and diarrhea.  Repeat labs today to make sure level has normalized.

## 2014-04-30 ENCOUNTER — Ambulatory Visit (INDEPENDENT_AMBULATORY_CARE_PROVIDER_SITE_OTHER): Payer: Medicare Other | Admitting: Family Medicine

## 2014-04-30 ENCOUNTER — Encounter: Payer: Self-pay | Admitting: Family Medicine

## 2014-04-30 VITALS — BP 104/87 | HR 98 | Temp 98.1°F | Resp 16 | Ht 63.0 in | Wt 120.1 lb

## 2014-04-30 DIAGNOSIS — M810 Age-related osteoporosis without current pathological fracture: Secondary | ICD-10-CM | POA: Diagnosis not present

## 2014-04-30 DIAGNOSIS — Z0000 Encounter for general adult medical examination without abnormal findings: Secondary | ICD-10-CM | POA: Diagnosis not present

## 2014-04-30 DIAGNOSIS — Z79899 Other long term (current) drug therapy: Secondary | ICD-10-CM | POA: Diagnosis not present

## 2014-04-30 DIAGNOSIS — Z1231 Encounter for screening mammogram for malignant neoplasm of breast: Secondary | ICD-10-CM

## 2014-04-30 DIAGNOSIS — R634 Abnormal weight loss: Secondary | ICD-10-CM

## 2014-04-30 DIAGNOSIS — Z23 Encounter for immunization: Secondary | ICD-10-CM

## 2014-04-30 DIAGNOSIS — F32A Depression, unspecified: Secondary | ICD-10-CM

## 2014-04-30 DIAGNOSIS — Z8669 Personal history of other diseases of the nervous system and sense organs: Secondary | ICD-10-CM

## 2014-04-30 DIAGNOSIS — Z85828 Personal history of other malignant neoplasm of skin: Secondary | ICD-10-CM

## 2014-04-30 DIAGNOSIS — F329 Major depressive disorder, single episode, unspecified: Secondary | ICD-10-CM

## 2014-04-30 NOTE — Assessment & Plan Note (Signed)
Chronic problem.  Pt continues to struggle w/ this but is not interested in switching meds at this time.  Pt's family is the source of her stress.  Will continue to follow.
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
## 2014-04-30 NOTE — Progress Notes (Signed)
Pre visit review using our clinic review tool, if applicable. No additional management support is needed unless otherwise documented below in the visit note. 

## 2014-04-30 NOTE — Patient Instructions (Signed)
Follow up in 1 year or as needed We'll call you with your mammo and bone density appts We'll call you with your Derm appt We'll notify you of your lab results and make any changes if needed Ask [facility] about the shingles shot Call with any questions or concerns Happy Holidays!!!

## 2014-04-30 NOTE — Assessment & Plan Note (Signed)
Pt's PE WNL for age.  UTD on colonoscopy.  Due for mammo and DEXA- ordered entered.  Written screening schedule updated and given to pt.  Check labs.  Anticipatory guidance provided.

## 2014-04-30 NOTE — Assessment & Plan Note (Signed)
Pt's no longer following w/ neuro.  Not on meds.  Has been seizure free.  No driving restrictions.

## 2014-04-30 NOTE — Assessment & Plan Note (Signed)
Chronic problem.  Due for DEXA.  On Ca and Vit D.  Referral entered for DEXA.  Will follow.

## 2014-04-30 NOTE — Assessment & Plan Note (Signed)
Pt has hx of this.  Is asking for Derm referral for complete skin evaluation.  Referral entered.

## 2014-05-01 ENCOUNTER — Encounter: Payer: Self-pay | Admitting: General Practice

## 2014-05-01 LAB — CBC WITH DIFFERENTIAL/PLATELET
BASOS ABS: 0 10*3/uL (ref 0.0–0.1)
BASOS PCT: 0.2 % (ref 0.0–3.0)
EOS ABS: 0 10*3/uL (ref 0.0–0.7)
Eosinophils Relative: 0.5 % (ref 0.0–5.0)
HCT: 43.8 % (ref 36.0–46.0)
Hemoglobin: 14.3 g/dL (ref 12.0–15.0)
LYMPHS PCT: 15.2 % (ref 12.0–46.0)
Lymphs Abs: 1.4 10*3/uL (ref 0.7–4.0)
MCHC: 32.5 g/dL (ref 30.0–36.0)
MCV: 88.5 fl (ref 78.0–100.0)
MONO ABS: 0.3 10*3/uL (ref 0.1–1.0)
Monocytes Relative: 3.4 % (ref 3.0–12.0)
NEUTROS ABS: 7.6 10*3/uL (ref 1.4–7.7)
NEUTROS PCT: 80.7 % — AB (ref 43.0–77.0)
Platelets: 300 10*3/uL (ref 150.0–400.0)
RBC: 4.95 Mil/uL (ref 3.87–5.11)
RDW: 12 % (ref 11.5–15.5)
WBC: 9.4 10*3/uL (ref 4.0–10.5)

## 2014-05-01 LAB — HEPATIC FUNCTION PANEL
ALK PHOS: 64 U/L (ref 39–117)
ALT: 17 U/L (ref 0–35)
AST: 22 U/L (ref 0–37)
Albumin: 3.8 g/dL (ref 3.5–5.2)
BILIRUBIN DIRECT: 0.1 mg/dL (ref 0.0–0.3)
BILIRUBIN TOTAL: 0.5 mg/dL (ref 0.2–1.2)
TOTAL PROTEIN: 7.1 g/dL (ref 6.0–8.3)

## 2014-05-01 LAB — BASIC METABOLIC PANEL
BUN: 12 mg/dL (ref 6–23)
CHLORIDE: 100 meq/L (ref 96–112)
CO2: 18 meq/L — AB (ref 19–32)
Calcium: 9.7 mg/dL (ref 8.4–10.5)
Creatinine, Ser: 0.9 mg/dL (ref 0.4–1.2)
GFR: 64.44 mL/min (ref >60.00)
GLUCOSE: 95 mg/dL (ref 70–99)
POTASSIUM: 4.1 meq/L (ref 3.5–5.1)
SODIUM: 140 meq/L (ref 135–145)

## 2014-05-01 LAB — TSH: TSH: 1.14 u[IU]/mL (ref 0.35–4.50)

## 2014-05-01 LAB — LIPID PANEL
CHOLESTEROL: 225 mg/dL — AB (ref 0–200)
HDL: 77.3 mg/dL (ref >39.00)
LDL Cholesterol: 138 mg/dL — ABNORMAL HIGH (ref 0–99)
NONHDL: 147.7
Total CHOL/HDL Ratio: 3
Triglycerides: 47 mg/dL (ref 0.0–149.0)
VLDL: 9.4 mg/dL (ref 0.0–40.0)

## 2014-05-01 LAB — VITAMIN D 25 HYDROXY (VIT D DEFICIENCY, FRACTURES): VITD: 47.28 ng/mL (ref 30.00–100.00)

## 2014-06-23 ENCOUNTER — Encounter: Payer: Self-pay | Admitting: Gastroenterology

## 2014-07-01 ENCOUNTER — Encounter: Payer: Medicare Other | Admitting: Family Medicine

## 2014-07-28 DIAGNOSIS — Z79891 Long term (current) use of opiate analgesic: Secondary | ICD-10-CM | POA: Diagnosis not present

## 2014-07-28 DIAGNOSIS — M47816 Spondylosis without myelopathy or radiculopathy, lumbar region: Secondary | ICD-10-CM | POA: Diagnosis not present

## 2014-07-28 DIAGNOSIS — G894 Chronic pain syndrome: Secondary | ICD-10-CM | POA: Diagnosis not present

## 2014-09-22 DIAGNOSIS — M47816 Spondylosis without myelopathy or radiculopathy, lumbar region: Secondary | ICD-10-CM | POA: Diagnosis not present

## 2014-09-22 DIAGNOSIS — G894 Chronic pain syndrome: Secondary | ICD-10-CM | POA: Diagnosis not present

## 2014-09-22 DIAGNOSIS — Z79891 Long term (current) use of opiate analgesic: Secondary | ICD-10-CM | POA: Diagnosis not present

## 2014-10-23 ENCOUNTER — Encounter: Payer: Self-pay | Admitting: Gastroenterology

## 2014-11-17 DIAGNOSIS — Z79891 Long term (current) use of opiate analgesic: Secondary | ICD-10-CM | POA: Diagnosis not present

## 2014-11-17 DIAGNOSIS — M25552 Pain in left hip: Secondary | ICD-10-CM | POA: Diagnosis not present

## 2014-11-17 DIAGNOSIS — G894 Chronic pain syndrome: Secondary | ICD-10-CM | POA: Diagnosis not present

## 2014-11-17 DIAGNOSIS — M47816 Spondylosis without myelopathy or radiculopathy, lumbar region: Secondary | ICD-10-CM | POA: Diagnosis not present

## 2015-01-12 DIAGNOSIS — Z79891 Long term (current) use of opiate analgesic: Secondary | ICD-10-CM | POA: Diagnosis not present

## 2015-01-12 DIAGNOSIS — M47816 Spondylosis without myelopathy or radiculopathy, lumbar region: Secondary | ICD-10-CM | POA: Diagnosis not present

## 2015-01-12 DIAGNOSIS — M25552 Pain in left hip: Secondary | ICD-10-CM | POA: Diagnosis not present

## 2015-01-12 DIAGNOSIS — G894 Chronic pain syndrome: Secondary | ICD-10-CM | POA: Diagnosis not present

## 2015-03-09 DIAGNOSIS — M25552 Pain in left hip: Secondary | ICD-10-CM | POA: Diagnosis not present

## 2015-03-09 DIAGNOSIS — Z79891 Long term (current) use of opiate analgesic: Secondary | ICD-10-CM | POA: Diagnosis not present

## 2015-03-09 DIAGNOSIS — M47816 Spondylosis without myelopathy or radiculopathy, lumbar region: Secondary | ICD-10-CM | POA: Diagnosis not present

## 2015-03-09 DIAGNOSIS — G894 Chronic pain syndrome: Secondary | ICD-10-CM | POA: Diagnosis not present

## 2015-05-11 DIAGNOSIS — M47816 Spondylosis without myelopathy or radiculopathy, lumbar region: Secondary | ICD-10-CM | POA: Diagnosis not present

## 2015-05-11 DIAGNOSIS — G894 Chronic pain syndrome: Secondary | ICD-10-CM | POA: Diagnosis not present

## 2015-05-11 DIAGNOSIS — Z79891 Long term (current) use of opiate analgesic: Secondary | ICD-10-CM | POA: Diagnosis not present

## 2015-05-11 DIAGNOSIS — M25552 Pain in left hip: Secondary | ICD-10-CM | POA: Diagnosis not present

## 2015-06-02 ENCOUNTER — Ambulatory Visit (INDEPENDENT_AMBULATORY_CARE_PROVIDER_SITE_OTHER): Payer: Medicare Other | Admitting: Family

## 2015-06-02 ENCOUNTER — Telehealth: Payer: Self-pay | Admitting: Family

## 2015-06-02 ENCOUNTER — Encounter: Payer: Self-pay | Admitting: Family

## 2015-06-02 VITALS — BP 110/76 | HR 89 | Temp 98.2°F | Resp 16 | Ht 62.5 in | Wt 118.0 lb

## 2015-06-02 DIAGNOSIS — M810 Age-related osteoporosis without current pathological fracture: Secondary | ICD-10-CM | POA: Diagnosis not present

## 2015-06-02 DIAGNOSIS — Z23 Encounter for immunization: Secondary | ICD-10-CM | POA: Diagnosis not present

## 2015-06-02 DIAGNOSIS — Z1231 Encounter for screening mammogram for malignant neoplasm of breast: Secondary | ICD-10-CM | POA: Diagnosis not present

## 2015-06-02 DIAGNOSIS — R350 Frequency of micturition: Secondary | ICD-10-CM

## 2015-06-02 DIAGNOSIS — Z8639 Personal history of other endocrine, nutritional and metabolic disease: Secondary | ICD-10-CM

## 2015-06-02 DIAGNOSIS — E785 Hyperlipidemia, unspecified: Secondary | ICD-10-CM

## 2015-06-02 DIAGNOSIS — Z1239 Encounter for other screening for malignant neoplasm of breast: Secondary | ICD-10-CM

## 2015-06-02 DIAGNOSIS — Z0000 Encounter for general adult medical examination without abnormal findings: Secondary | ICD-10-CM | POA: Diagnosis not present

## 2015-06-02 LAB — LIPID PANEL
Cholesterol: 213 mg/dL — ABNORMAL HIGH (ref 0–200)
HDL: 81 mg/dL (ref >39.00)
LDL CALC: 115 mg/dL — AB (ref 0–99)
NONHDL: 132.33
Total CHOL/HDL Ratio: 3
Triglycerides: 89 mg/dL (ref 0.0–149.0)
VLDL: 17.8 mg/dL (ref 0.0–40.0)

## 2015-06-02 LAB — BASIC METABOLIC PANEL
BUN: 14 mg/dL (ref 6–23)
CO2: 29 mEq/L (ref 19–32)
CREATININE: 0.81 mg/dL (ref 0.40–1.20)
Calcium: 9.2 mg/dL (ref 8.4–10.5)
Chloride: 101 mEq/L (ref 96–112)
GFR: 74.4 mL/min (ref >60.00)
GLUCOSE: 89 mg/dL (ref 70–99)
POTASSIUM: 4 meq/L (ref 3.5–5.1)
Sodium: 138 mEq/L (ref 135–145)

## 2015-06-02 LAB — URINALYSIS, ROUTINE W REFLEX MICROSCOPIC
BILIRUBIN URINE: NEGATIVE
Ketones, ur: NEGATIVE
Leukocytes, UA: NEGATIVE
NITRITE: NEGATIVE
PH: 6 (ref 5.0–8.0)
Specific Gravity, Urine: >=1.03 — AB (ref 1.000–1.030)
TOTAL PROTEIN, URINE-UPE24: NEGATIVE
URINE GLUCOSE: NEGATIVE
UROBILINOGEN UA: 0.2 (ref 0.0–1.0)

## 2015-06-02 NOTE — Progress Notes (Signed)
Pre visit review using our clinic review tool, if applicable. No additional management support is needed unless otherwise documented below in the visit note. 

## 2015-06-02 NOTE — Patient Instructions (Addendum)
Please contact Dr. Eugenia Pancoast office and schedule your repeat colonoscopy.   Please schedule a mammogram in imaging on the first floor. You will be contacted about your bone density.

## 2015-06-02 NOTE — Progress Notes (Signed)
Patient presents today for complete physical.  Immunizations:  Td due Diet: healthy Exercise: walking Colonoscopy: overdue Dexa: due Pap Smear: hysterectomy Mammogram: due  Review of Systems  Constitutional: Negative for weight loss.  HENT: Negative for congestion.   Respiratory: Negative for cough and shortness of breath.   Cardiovascular: Negative for chest pain.  Gastrointestinal: Negative for nausea and vomiting.  Genitourinary: Positive for frequency. Negative for dysuria.  Musculoskeletal: Positive for myalgias, back pain and neck pain.  Skin: Negative for rash.  Neurological: Negative for seizures.  Psychiatric/Behavioral: Negative for depression. The patient is not nervous/anxious.    Past Medical History  Diagnosis Date  . Depression   . Fibromyalgia   . Migraine   . Recurrent UTI   . Osteoporosis 05/01/2011  . Seizures ([place]) 2000    HAD SEVERAL AT THAT TIME ONLY-NO TREATMENT  . Allergy   . Cataract     Social History   Social History  . Marital Status: Married    Spouse Name: N/A  . Number of Children: N/A  . Years of Education: N/A   Occupational History  . Not on file.   Social History Main Topics  . Smoking status: Never Smoker   . Smokeless tobacco: Never Used  . Alcohol Use: No  . Drug Use: No  . Sexual Activity: Not on file   Other Topics Concern  . Not on file   Social History Narrative   Retired, former Press photographer- computers   Grew up in [place]   Single   1 daughter- lives in [place], has 2 grown grandchildren   No pets   Enjoys crosswords, sudoku, reading, watching basketball   Completed college    Past Surgical History  Procedure Laterality Date  . Abdominal hysterectomy    . Tubal ligation    . Nasal septum surgery    . Ovarian cyst removal      BOTH  . Kyphosis surgery      X2  . Upper gastrointestinal endoscopy    . Eye surgery Bilateral 2013    Cataract removal    Family History  Problem Relation Age of  Onset  . Heart disease Mother   . Stroke Mother   . Hypertension Mother   . Diabetes Mother   . Heart disease Father   . Stroke Father   . Hypertension Father   . Heart disease Maternal Grandmother   . Stroke Maternal Grandmother   . Stomach cancer Paternal Grandmother 108  . Colon cancer Neg Hx   . COPD Sister   . Edema Sister     Allergies  Allergen Reactions  . Codeine Sulfate Itching and Nausea And Vomiting    TROUBLE BREATHING  . Meperidine Hcl Itching and Nausea And Vomiting    TROUBLE BREATHING  . Sulfamethoxazole Itching and Nausea Only    TROUBLE BREATHING    Current Outpatient Prescriptions on File Prior to Visit  Medication Sig Dispense Refill  . aspirin 81 MG tablet Take 81 mg by mouth daily.      Marland Kitchen aspirin-acetaminophen-caffeine (EXCEDRIN MIGRAINE) 250-250-65 MG per tablet Take 1-2 tablets by mouth as needed.      Marland Kitchen buPROPion (WELLBUTRIN) 100 MG tablet Take 200 mg by mouth 2 (two) times daily.     . Calcium Carbonate-Vitamin D (OSCAL 500/200 D-3 PO) Take by mouth 2 (two) times daily.      Marland Kitchen docusate-casanthranol (PERICOLACE) 100-30 MG per capsule Take 1-2 capsules by mouth daily as needed.      Marland Kitchen  eletriptan (RELPAX) 40 MG tablet One tablet by mouth as needed for migraine headache.  If the headache improves and then returns, dose may be repeated after 2 hours have elapsed since first dose (do not exceed 80 mg per day). may repeat in 2 hours if necessary     . loratadine (CLARITIN) 10 MG tablet Take 10 mg by mouth daily.      Marland Kitchen morphine (MS CONTIN) 15 MG 12 hr tablet Take 15 mg by mouth 2 (two) times daily.     . Multiple Vitamin (MULTIVITAMIN) tablet Take 1 tablet by mouth daily.      Marland Kitchen olopatadine (PATANOL) 0.1 % ophthalmic solution Place 1 drop into both eyes 2 (two) times daily.      . Pseudoephedrine-Acetaminophen (TYLENOL SINUS MAX ST PO) Take 2 tablets by mouth as needed.      . ranitidine (ZANTAC) 150 MG tablet Take 150 mg by mouth 2 (two) times daily.       No current facility-administered medications on file prior to visit.    BP 110/76 mmHg  Pulse 89  Temp(Src) 98.2 F (36.8 C) (Oral)  Resp 16  Ht 5' 2.5" (1.588 m)  Wt 118 lb (53.524 kg)  BMI 21.22 kg/m2  SpO2 100%   Physical Exam  Constitutional: She is oriented to person, place, and time. She appears well-developed and well-nourished. No distress.  HENT:  Head: Normocephalic and atraumatic.  Right Ear: Tympanic membrane and ear canal normal.  Left Ear: Tympanic membrane and ear canal normal.  Mouth/Throat: Oropharynx is clear and moist.  Eyes: Pupils are equal, round, and reactive to light. No scleral icterus.  Neck: Normal range of motion. No thyromegaly present.  Cardiovascular: Normal rate and regular rhythm.   No murmur heard. Pulmonary/Chest: Effort normal and breath sounds normal. No respiratory distress. He has no wheezes. She has no rales. She exhibits no tenderness.  Abdominal: Soft. Bowel sounds are normal. He exhibits no distension and no mass. There is no tenderness. There is no rebound and no guarding.  Musculoskeletal: She exhibits no edema.  Lymphadenopathy:    She has no cervical adenopathy.  Neurological: She is alert and oriented to person, place, and time. Unable to elicit knee reflexes. She exhibits normal muscle tone. Coordination normal.  Skin: Skin is warm and dry.  Psychiatric: She has a normal mood and affect. Her behavior is normal. Judgment and thought content normal.  Breasts: Examined lying Right: Without masses, retractions, discharge or axillary adenopathy.  Left: Without masses, retractions, discharge or axillary adenopathy.  Pelvic:  Not performed         Assessment & Plan:

## 2015-06-02 NOTE — Assessment & Plan Note (Signed)
Refer for dexa and mammogram. She will schedule colo.

## 2015-06-02 NOTE — Telephone Encounter (Signed)
Forms mailed to pt.

## 2015-06-02 NOTE — Progress Notes (Signed)
Subjective:    Amy Vargas is a 69 y.o. female who presents for Medicare Annual/Subsequent preventive examination.  Preventive Screening-Counseling & Management  Tobacco History  Smoking status  . Never Smoker   Smokeless tobacco  . Never Used     Problems Prior to Visit 1. Depression- current meds include wellbutrin. Reports well controlled.    2.  Migraines- uses prn relpax.reports rare migraines,  Uses excedrin migraine prn  3.  Chronic pain- due to chronic back pain and FM. She follows with Dr. Hardin Negus.    4.  GERD- maintained on zantac.  Reports gerd controlled.   5.  Osteoporosis- has tried fosamax- "didn't agree with me." Had GI pain, anorexia.  could not afford forteo.    6.  Hx of tonic-clonic seizures- last seizure was at least 15 years ago.  7.  Fibromyalgia- reports that she has tried multiple meds in the past- feels that wellbutrin helped the most.   Current Problems (verified) Patient Active Problem List   Diagnosis Date Noted  . Hx of basal cell carcinoma 04/30/2014  . Hypokalemia 06/05/2012  . Metabolic acidosis 0000000  . Weight loss, unintentional 12/10/2011  . Sinusitis 11/24/2011  . General medical examination 06/21/2011  . Osteoporosis 05/01/2011  . Depression 02/27/2007  . MIGRAINE HEADACHE 02/27/2007  . URINARY TRACT INFECTION, RECURRENT 02/27/2007  . FIBROMYALGIA 02/27/2007  . Hx of tonic-clonic seizures 02/27/2007    Medications Prior to Visit Current Outpatient Prescriptions on File Prior to Visit  Medication Sig Dispense Refill  . aspirin 81 MG tablet Take 81 mg by mouth daily.      Marland Kitchen aspirin-acetaminophen-caffeine (EXCEDRIN MIGRAINE) 250-250-65 MG per tablet Take 1-2 tablets by mouth as needed.      Marland Kitchen buPROPion (WELLBUTRIN) 100 MG tablet Take 200 mg by mouth 2 (two) times daily.     . Calcium Carbonate-Vitamin D (OSCAL 500/200 D-3 PO) Take by mouth 2 (two) times daily.      Marland Kitchen docusate-casanthranol (PERICOLACE) 100-30 MG per  capsule Take 1-2 capsules by mouth daily as needed.      . eletriptan (RELPAX) 40 MG tablet One tablet by mouth as needed for migraine headache.  If the headache improves and then returns, dose may be repeated after 2 hours have elapsed since first dose (do not exceed 80 mg per day). may repeat in 2 hours if necessary     . loratadine (CLARITIN) 10 MG tablet Take 10 mg by mouth daily.      Marland Kitchen morphine (MS CONTIN) 15 MG 12 hr tablet Take 15 mg by mouth 2 (two) times daily.     . Multiple Vitamin (MULTIVITAMIN) tablet Take 1 tablet by mouth daily.      Marland Kitchen olopatadine (PATANOL) 0.1 % ophthalmic solution Place 1 drop into both eyes 2 (two) times daily.      . Pseudoephedrine-Acetaminophen (TYLENOL SINUS MAX ST PO) Take 2 tablets by mouth as needed.      . ranitidine (ZANTAC) 150 MG tablet Take 150 mg by mouth 2 (two) times daily.      No current facility-administered medications on file prior to visit.    Current Medications (verified) Current Outpatient Prescriptions  Medication Sig Dispense Refill  . aspirin 81 MG tablet Take 81 mg by mouth daily.      Marland Kitchen aspirin-acetaminophen-caffeine (EXCEDRIN MIGRAINE) 250-250-65 MG per tablet Take 1-2 tablets by mouth as needed.      Marland Kitchen buPROPion (WELLBUTRIN) 100 MG tablet Take 200 mg by mouth 2 (two)  times daily.     . Calcium Carbonate-Vitamin D (OSCAL 500/200 D-3 PO) Take by mouth 2 (two) times daily.      Marland Kitchen docusate-casanthranol (PERICOLACE) 100-30 MG per capsule Take 1-2 capsules by mouth daily as needed.      . eletriptan (RELPAX) 40 MG tablet One tablet by mouth as needed for migraine headache.  If the headache improves and then returns, dose may be repeated after 2 hours have elapsed since first dose (do not exceed 80 mg per day). may repeat in 2 hours if necessary     . loratadine (CLARITIN) 10 MG tablet Take 10 mg by mouth daily.      Marland Kitchen morphine (MS CONTIN) 15 MG 12 hr tablet Take 15 mg by mouth 2 (two) times daily.     Marland Kitchen morphine (MSIR) 15 MG tablet  Take 15 mg by mouth every 6 (six) hours as needed. For breakthrough pain  0  . Multiple Vitamin (MULTIVITAMIN) tablet Take 1 tablet by mouth daily.      . NON FORMULARY Alway eye drops. 1 drop into each eye PRN allergies.    Marland Kitchen olopatadine (PATANOL) 0.1 % ophthalmic solution Place 1 drop into both eyes 2 (two) times daily.      . Pseudoephedrine-Acetaminophen (TYLENOL SINUS MAX ST PO) Take 2 tablets by mouth as needed.      . ranitidine (ZANTAC) 150 MG tablet Take 150 mg by mouth 2 (two) times daily.      No current facility-administered medications for this visit.     Allergies (verified) Codeine sulfate; Meperidine hcl; and Sulfamethoxazole   PAST HISTORY  Family History Family History  Problem Relation Age of Onset  . Heart disease Mother   . Stroke Mother   . Hypertension Mother   . Diabetes Mother   . Heart disease Father   . Stroke Father   . Hypertension Father   . Heart disease Maternal Grandmother   . Stroke Maternal Grandmother   . Stomach cancer Paternal Grandmother 7  . Colon cancer Neg Hx   . COPD Sister   . Edema Sister     Social History Social History  Substance Use Topics  . Smoking status: Never Smoker   . Smokeless tobacco: Never Used  . Alcohol Use: No     Are there smokers in your home (other than you)? No  Risk Factors Current exercise habits: Home exercise routine includes walking, 45 min/day during warm weather. .  Dietary issues discussed: reports healthy diet   Cardiac risk factors: advanced age (older than 89 for men, 74 for women).  Depression Screen (Note: if answer to either of the following is "Yes", a more complete depression screening is indicated)   Over the past two weeks, have you felt down, depressed or hopeless? No  Over the past two weeks, have you felt little interest or pleasure in doing things? No  Have you lost interest or pleasure in daily life? No  Do you often feel hopeless? No  Do you cry easily over simple  problems? No  Activities of Daily Living In your present state of health, do you have any difficulty performing the following activities?:  Driving? No Managing money?  No Feeding yourself? No Getting from bed to chair? No . Climbing a flight of stairs? No Preparing food and eating?: No Bathing or showering? No Getting dressed: No Getting to the toilet? No Using the toilet:No Moving around from place to place: No In the past year have  you fallen or had a near fall?:No- had fall >1 year ago   Are you sexually active?  No  Do you have more than one partner?  No  Hearing Difficulties: No Do you often ask people to speak up or repeat themselves? No Do you experience ringing or noises in your ears? No Do you have difficulty understanding soft or whispered voices? No   Do you feel that you have a problem with memory? No  Do you often misplace items? No  Do you feel safe at home?  Yes- sometimes gets scared at night though  Cognitive Testing  Alert? Yes  Normal Appearance?Yes  Oriented to person? Yes  Place? Yes   Time? Yes  Recall of three objects?  Yes  Can perform simple calculations? Yes  Displays appropriate judgment?Yes  Can read the correct time from a watch face?Yes   Advanced Directives have been discussed with the patient? Yes  List the Names of Other Physician/Practitioners you currently use: 1.    Indicate any recent Medical Services you may have received from other than [facility] providers in the past year (date may be approximate).  Immunization History  Administered Date(s) Administered  . Influenza, High Dose Seasonal PF 03/14/2014, 04/08/2015  . Pneumococcal Conjugate-13 04/30/2014  . Pneumococcal Polysaccharide-23 06/21/2011  . Td 06/19/2002    Screening Tests Health Maintenance  Topic Date Due  . Hepatitis C Screening  04/11/1946  . TETANUS/TDAP  06/19/2012  . MAMMOGRAM  12/21/2012  . ZOSTAVAX  06/01/2016 (Originally 11/18/2005)  . INFLUENZA VACCINE   01/18/2016  . COLONOSCOPY  06/01/2021  . DEXA SCAN  Completed  . PNA vac Low Risk Adult  Completed    All answers were reviewed with the patient and necessary referrals were made:  O'SULLIVAN,Chiffon Kittleson S., NP   06/02/2015   History reviewed: allergies, current medications, past family history, past medical history, past social history, past surgical history and problem list  Review of Systems .    Objective:       Body mass index is 21.22 kg/(m^2). BP 110/76 mmHg  Pulse 89  Temp(Src) 98.2 F (36.8 C) (Oral)  Resp 16  Ht 5' 2.5" (1.588 m)  Wt 118 lb (53.524 kg)  BMI 21.22 kg/m2  SpO2 100%        Assessment:          Plan:     During the course of the visit the patient was educated and counseled about appropriate screening and preventive services including:    Td vaccine  Screening mammography  Bone densitometry screening  Colorectal cancer screening  Advanced directives: will provide copy for pt review  Diet review for nutrition referral? Yes ____  Not Indicated __x__   Patient Instructions (the written plan) was given to the patient.  Medicare Attestation I have personally reviewed: The patient's medical and social history Their use of alcohol, tobacco or illicit drugs Their current medications and supplements The patient's functional ability including ADLs,fall risks, home safety risks, cognitive, and hearing and visual impairment Diet and physical activities Evidence for depression or mood disorders  The patient's weight, height, BMI, and visual acuity have been recorded in the chart.  I have made referrals, counseling, and provided education to the patient based on review of the above and I have provided the patient with a written personalized care plan for preventive services.     O'SULLIVAN,Bret Stamour S., NP   06/02/2015

## 2015-06-02 NOTE — Telephone Encounter (Signed)
Could you please mail a copy of there HCPOA and MOST form to patient? thanks

## 2015-06-03 ENCOUNTER — Encounter: Payer: Self-pay | Admitting: Family

## 2015-06-22 ENCOUNTER — Ambulatory Visit (HOSPITAL_BASED_OUTPATIENT_CLINIC_OR_DEPARTMENT_OTHER)
Admission: RE | Admit: 2015-06-22 | Discharge: 2015-06-22 | Disposition: A | Discharge status: Home / Self Care | Payer: Medicare Other | Source: Ambulatory Visit | Attending: Family | Admitting: Family

## 2015-06-22 ENCOUNTER — Telehealth: Payer: Self-pay | Admitting: Family

## 2015-06-22 DIAGNOSIS — M810 Age-related osteoporosis without current pathological fracture: Secondary | ICD-10-CM | POA: Insufficient documentation

## 2015-06-22 DIAGNOSIS — Z1239 Encounter for other screening for malignant neoplasm of breast: Secondary | ICD-10-CM

## 2015-06-22 DIAGNOSIS — Z780 Asymptomatic menopausal state: Secondary | ICD-10-CM | POA: Insufficient documentation

## 2015-06-22 DIAGNOSIS — Z1231 Encounter for screening mammogram for malignant neoplasm of breast: Secondary | ICD-10-CM | POA: Diagnosis not present

## 2015-06-22 DIAGNOSIS — Z90710 Acquired absence of both cervix and uterus: Secondary | ICD-10-CM | POA: Insufficient documentation

## 2015-06-22 NOTE — Telephone Encounter (Addendum)
Please let patient know that her bone density shows osteoporosis.  I would like to initiate Prolia injections for her.  We discussed this at her visit.  Could you please forward to [employer]?  Also, continue Caltrate one tablet by mouth twice daily. This is available over the counter.  In addition, please ensure regular weight bearing exercise such as walking.

## 2015-06-22 NOTE — Telephone Encounter (Signed)
Rose, please initiate Prolia benefits/authorization.  Called pt and made her aware of results. Pt verbalized understanding. Ok to proceed with authorization for Prolia.

## 2015-06-29 NOTE — Telephone Encounter (Signed)
I have electronically submitted pt's info for Prolia insurance verification and will notify you once I have a response. Thank you. °

## 2015-07-05 NOTE — Telephone Encounter (Signed)
I have rec'd Ms. Mccully insurance verification and w/out an OV her estimated responsibility will [phone]; w/an OV the estimated responsibility will be $205.  Please make pt aware this is an estimate and we will not know and exact amt until insurance(s) has/have paid.  I have sent a copy of the summary of benefits to be scanned into pt's chart.    Once pt recs injection, please let me know actual injection date so I can update the Prolia portal.    If pt cannot afford $195-$205 for her injection, please advise her to contact [employer] at [phone] and select option #1 to see if she qualifies for one of their assistance programs.  If she qualifies they will instruct her how to proceed.  If you have any questions, please let me know. Thank you.

## 2015-07-06 DIAGNOSIS — M25552 Pain in left hip: Secondary | ICD-10-CM | POA: Diagnosis not present

## 2015-07-06 DIAGNOSIS — G894 Chronic pain syndrome: Secondary | ICD-10-CM | POA: Diagnosis not present

## 2015-07-06 DIAGNOSIS — M47816 Spondylosis without myelopathy or radiculopathy, lumbar region: Secondary | ICD-10-CM | POA: Diagnosis not present

## 2015-07-06 DIAGNOSIS — Z79891 Long term (current) use of opiate analgesic: Secondary | ICD-10-CM | POA: Diagnosis not present

## 2015-07-07 NOTE — Telephone Encounter (Signed)
Notified pt and provided her with pt assistance #. She will let us know the outcome.

## 2015-08-31 DIAGNOSIS — M25552 Pain in left hip: Secondary | ICD-10-CM | POA: Diagnosis not present

## 2015-08-31 DIAGNOSIS — G894 Chronic pain syndrome: Secondary | ICD-10-CM | POA: Diagnosis not present

## 2015-08-31 DIAGNOSIS — M47816 Spondylosis without myelopathy or radiculopathy, lumbar region: Secondary | ICD-10-CM | POA: Diagnosis not present

## 2015-08-31 DIAGNOSIS — Z79891 Long term (current) use of opiate analgesic: Secondary | ICD-10-CM | POA: Diagnosis not present

## 2015-10-26 DIAGNOSIS — Z79891 Long term (current) use of opiate analgesic: Secondary | ICD-10-CM | POA: Diagnosis not present

## 2015-10-26 DIAGNOSIS — M47816 Spondylosis without myelopathy or radiculopathy, lumbar region: Secondary | ICD-10-CM | POA: Diagnosis not present

## 2015-10-26 DIAGNOSIS — M25552 Pain in left hip: Secondary | ICD-10-CM | POA: Diagnosis not present

## 2015-10-26 DIAGNOSIS — G894 Chronic pain syndrome: Secondary | ICD-10-CM | POA: Diagnosis not present

## 2015-12-01 ENCOUNTER — Ambulatory Visit: Payer: Medicare Other | Admitting: Family Medicine

## 2015-12-22 DIAGNOSIS — G894 Chronic pain syndrome: Secondary | ICD-10-CM | POA: Diagnosis not present

## 2015-12-22 DIAGNOSIS — Z79891 Long term (current) use of opiate analgesic: Secondary | ICD-10-CM | POA: Diagnosis not present

## 2015-12-22 DIAGNOSIS — M47816 Spondylosis without myelopathy or radiculopathy, lumbar region: Secondary | ICD-10-CM | POA: Diagnosis not present

## 2015-12-22 DIAGNOSIS — M25552 Pain in left hip: Secondary | ICD-10-CM | POA: Diagnosis not present

## 2016-02-16 DIAGNOSIS — Z79891 Long term (current) use of opiate analgesic: Secondary | ICD-10-CM | POA: Diagnosis not present

## 2016-02-16 DIAGNOSIS — G894 Chronic pain syndrome: Secondary | ICD-10-CM | POA: Diagnosis not present

## 2016-02-16 DIAGNOSIS — M25552 Pain in left hip: Secondary | ICD-10-CM | POA: Diagnosis not present

## 2016-02-16 DIAGNOSIS — M47816 Spondylosis without myelopathy or radiculopathy, lumbar region: Secondary | ICD-10-CM | POA: Diagnosis not present

## 2016-04-12 DIAGNOSIS — G894 Chronic pain syndrome: Secondary | ICD-10-CM | POA: Diagnosis not present

## 2016-04-12 DIAGNOSIS — M47816 Spondylosis without myelopathy or radiculopathy, lumbar region: Secondary | ICD-10-CM | POA: Diagnosis not present

## 2016-04-12 DIAGNOSIS — Z79891 Long term (current) use of opiate analgesic: Secondary | ICD-10-CM | POA: Diagnosis not present

## 2016-04-12 DIAGNOSIS — R12 Heartburn: Secondary | ICD-10-CM | POA: Diagnosis not present

## 2016-06-07 DIAGNOSIS — R12 Heartburn: Secondary | ICD-10-CM | POA: Diagnosis not present

## 2016-06-07 DIAGNOSIS — Z79891 Long term (current) use of opiate analgesic: Secondary | ICD-10-CM | POA: Diagnosis not present

## 2016-06-07 DIAGNOSIS — G894 Chronic pain syndrome: Secondary | ICD-10-CM | POA: Diagnosis not present

## 2016-06-07 DIAGNOSIS — M47812 Spondylosis without myelopathy or radiculopathy, cervical region: Secondary | ICD-10-CM | POA: Diagnosis not present

## 2016-06-07 DIAGNOSIS — M47816 Spondylosis without myelopathy or radiculopathy, lumbar region: Secondary | ICD-10-CM | POA: Diagnosis not present

## 2016-08-02 DIAGNOSIS — Z79891 Long term (current) use of opiate analgesic: Secondary | ICD-10-CM | POA: Diagnosis not present

## 2016-08-02 DIAGNOSIS — M47816 Spondylosis without myelopathy or radiculopathy, lumbar region: Secondary | ICD-10-CM | POA: Diagnosis not present

## 2016-08-02 DIAGNOSIS — R12 Heartburn: Secondary | ICD-10-CM | POA: Diagnosis not present

## 2016-08-02 DIAGNOSIS — G894 Chronic pain syndrome: Secondary | ICD-10-CM | POA: Diagnosis not present

## 2016-09-27 DIAGNOSIS — Z79891 Long term (current) use of opiate analgesic: Secondary | ICD-10-CM | POA: Diagnosis not present

## 2016-09-27 DIAGNOSIS — M47816 Spondylosis without myelopathy or radiculopathy, lumbar region: Secondary | ICD-10-CM | POA: Diagnosis not present

## 2016-09-27 DIAGNOSIS — R12 Heartburn: Secondary | ICD-10-CM | POA: Diagnosis not present

## 2016-09-27 DIAGNOSIS — G894 Chronic pain syndrome: Secondary | ICD-10-CM | POA: Diagnosis not present

## 2017-06-04 IMAGING — MG MM DIGITAL SCREENING BILAT W/ CAD
5 series · 5 of 5 positions shown · non-contrast
Comparison: Previous exam(s).

CLINICAL DATA: Screening.

EXAM:
DIGITAL SCREENING BILATERAL MAMMOGRAM WITH CAD

[R CC]
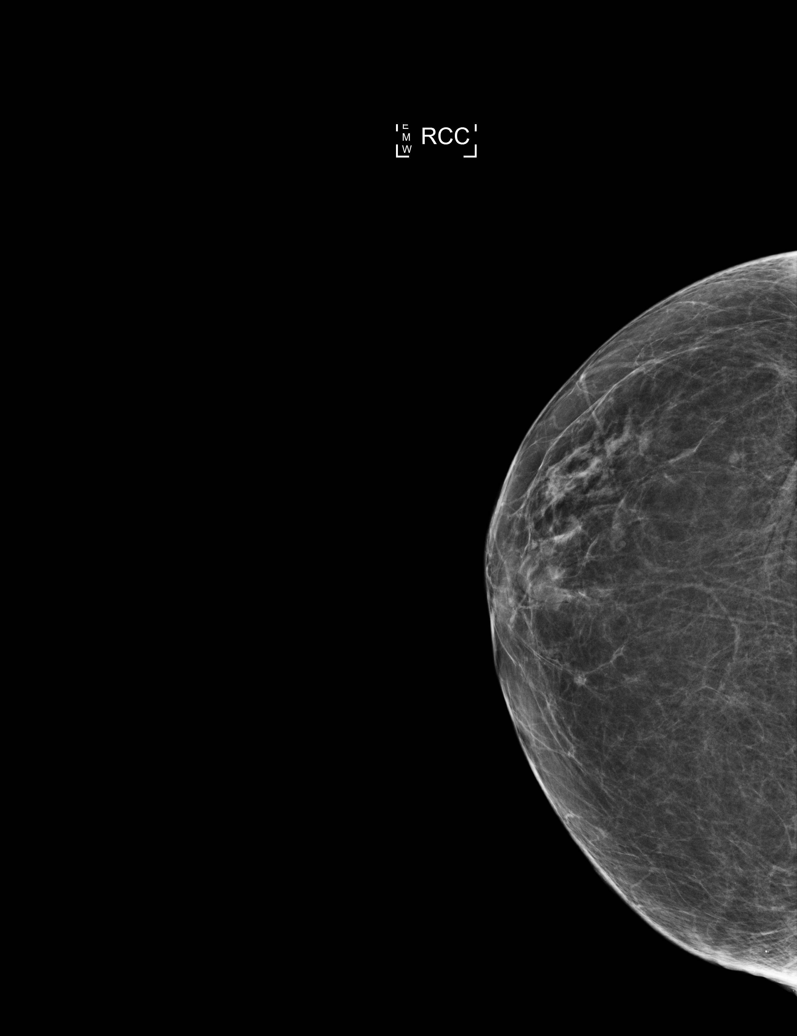

[L CC]
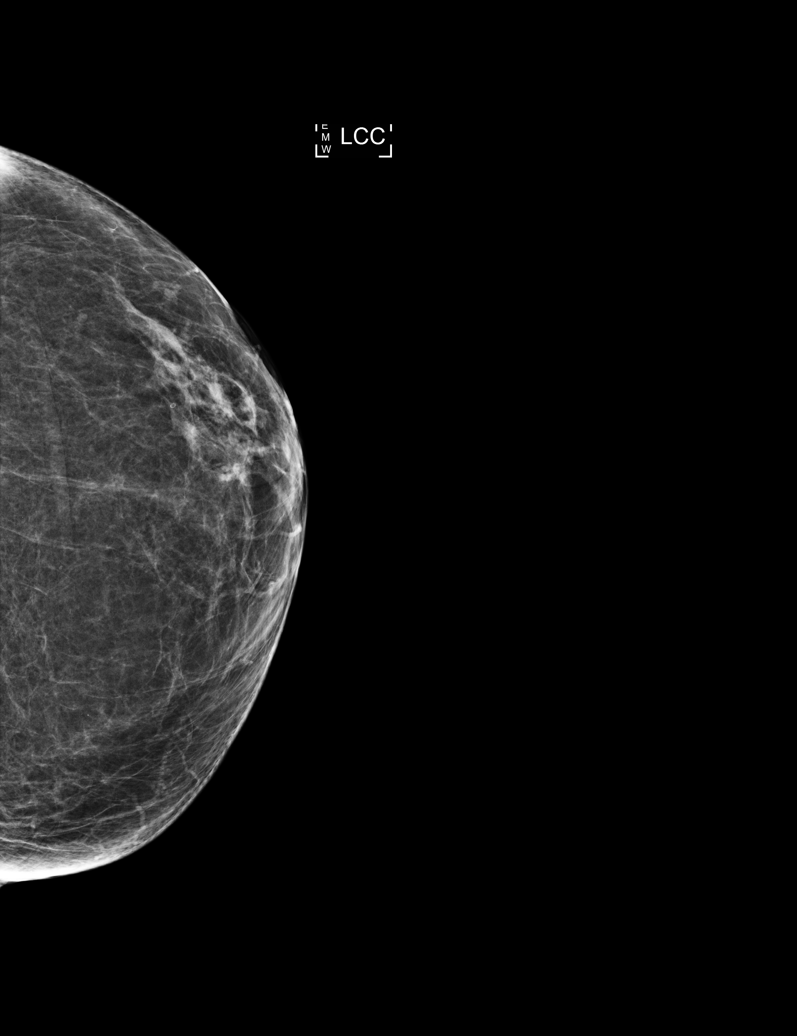

[L MLO (1 of 2)]
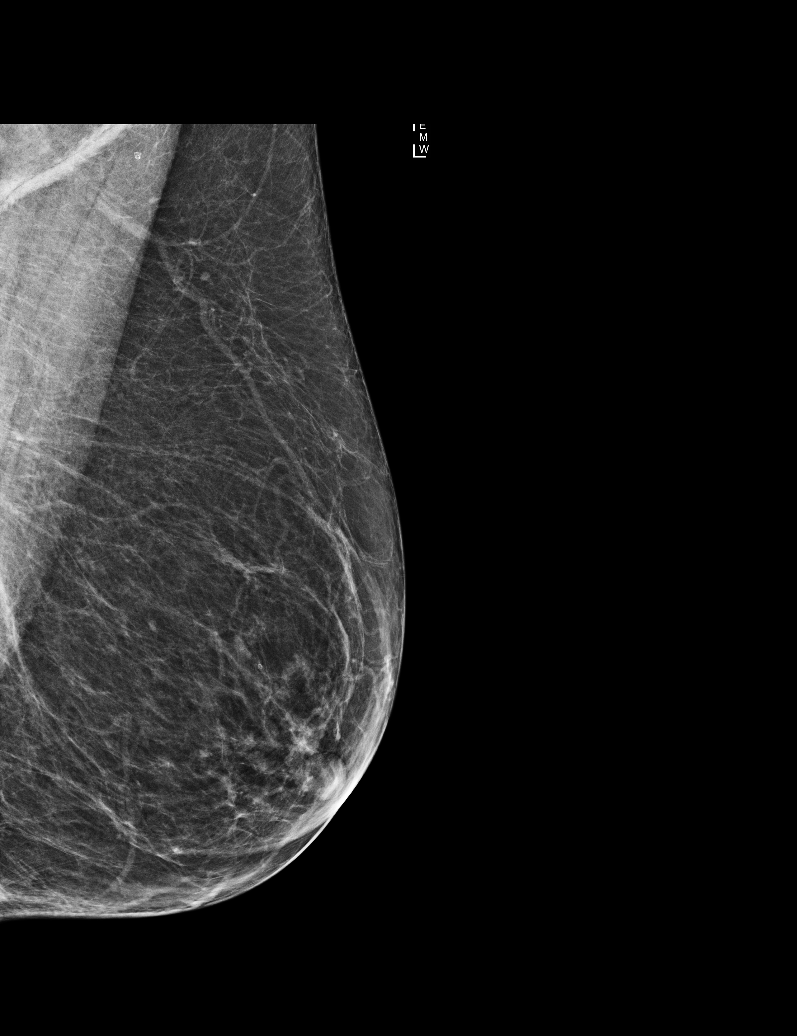

[L MLO (2 of 2)]
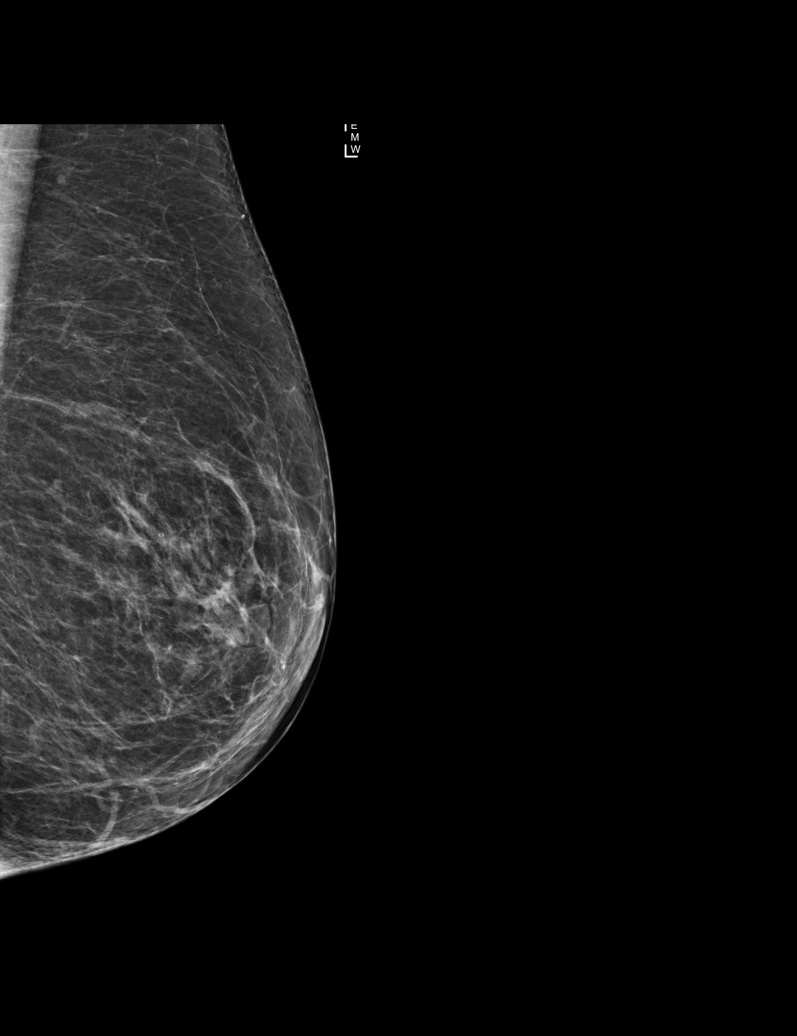

[R MLO]
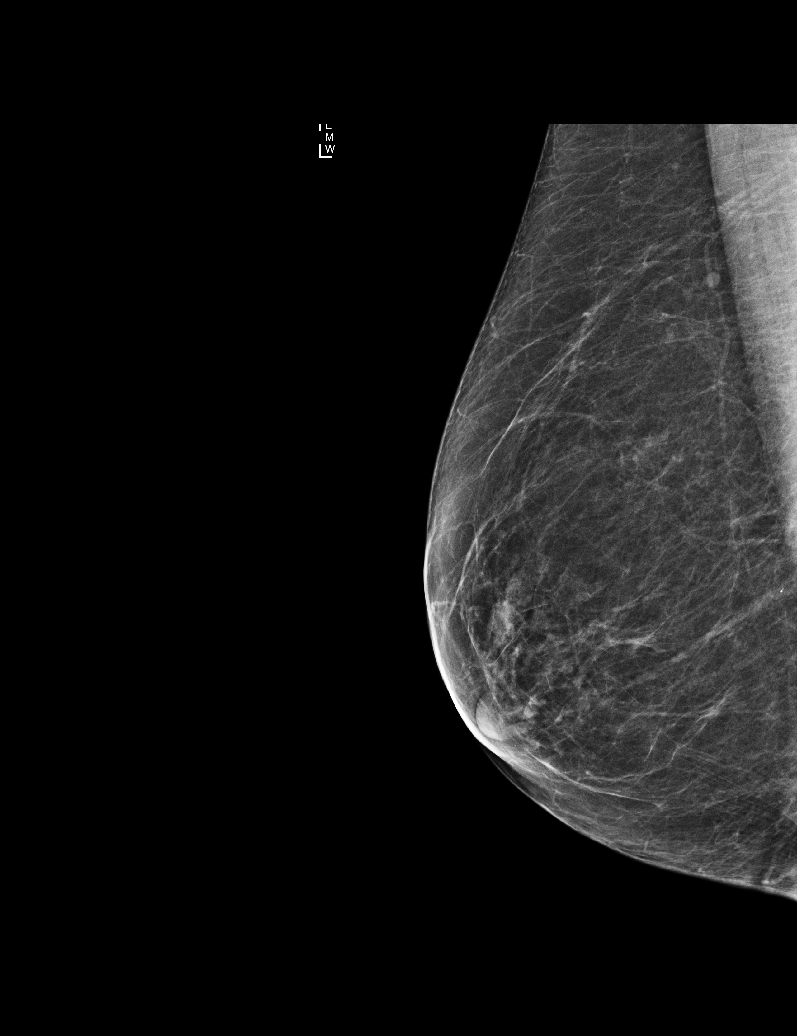

[5 of 5 positions shown; findings below may reference images not displayed]

ACR Breast Density Category b: There are scattered areas of
fibroglandular density.
FINDINGS: There are no findings suspicious for malignancy. Images were
processed with CAD.
IMPRESSION: No mammographic evidence of malignancy. A result letter of this
screening mammogram will be mailed directly to the patient.

RECOMMENDATION:
Screening mammogram in one year. (Code:AS-G-LCT)

BI-RADS CATEGORY  1: Negative.

## 2017-06-27 DIAGNOSIS — M545 Low back pain: Secondary | ICD-10-CM | POA: Diagnosis not present

## 2017-07-06 DIAGNOSIS — S2231XA Fracture of one rib, right side, initial encounter for closed fracture: Secondary | ICD-10-CM | POA: Diagnosis not present

## 2017-07-25 DIAGNOSIS — M533 Sacrococcygeal disorders, not elsewhere classified: Secondary | ICD-10-CM | POA: Diagnosis not present

## 2017-07-25 DIAGNOSIS — Z5181 Encounter for therapeutic drug level monitoring: Secondary | ICD-10-CM | POA: Diagnosis not present

## 2017-07-25 DIAGNOSIS — M4727 Other spondylosis with radiculopathy, lumbosacral region: Secondary | ICD-10-CM | POA: Diagnosis not present

## 2017-07-25 DIAGNOSIS — Z79891 Long term (current) use of opiate analgesic: Secondary | ICD-10-CM | POA: Diagnosis not present

## 2017-08-22 DIAGNOSIS — M4727 Other spondylosis with radiculopathy, lumbosacral region: Secondary | ICD-10-CM | POA: Diagnosis not present

## 2017-08-22 DIAGNOSIS — Z79891 Long term (current) use of opiate analgesic: Secondary | ICD-10-CM | POA: Diagnosis not present

## 2017-08-22 DIAGNOSIS — M533 Sacrococcygeal disorders, not elsewhere classified: Secondary | ICD-10-CM | POA: Diagnosis not present

## 2017-08-22 DIAGNOSIS — Z6821 Body mass index (BMI) 21.0-21.9, adult: Secondary | ICD-10-CM | POA: Diagnosis not present

## 2017-09-10 ENCOUNTER — Telehealth: Payer: Self-pay | Admitting: *Deleted

## 2017-09-10 NOTE — Telephone Encounter (Signed)
I need to see her first please.

## 2017-09-10 NOTE — Telephone Encounter (Signed)
Pt has not been since 2016. Please advise?  Reason for CRM: [facility] called and would like a  bone density scan scheduled for the patient please them at [phone] and the patients number is [phone]

## 2017-09-11 NOTE — Telephone Encounter (Signed)
Amy Vargas--  Please see PCP and pt response below.  Spoke with pt. She states she has not had a car since April last year and has no way to get to doctor appts etc...  States she is not currently under the care of a doctor and is unable to schedule anything at this time.

## 2017-09-12 NOTE — Telephone Encounter (Signed)
Noted  

## 2018-01-15 DIAGNOSIS — M47816 Spondylosis without myelopathy or radiculopathy, lumbar region: Secondary | ICD-10-CM | POA: Diagnosis not present

## 2018-01-15 DIAGNOSIS — G894 Chronic pain syndrome: Secondary | ICD-10-CM | POA: Diagnosis not present

## 2018-01-15 DIAGNOSIS — Z79891 Long term (current) use of opiate analgesic: Secondary | ICD-10-CM | POA: Diagnosis not present

## 2018-01-15 DIAGNOSIS — R12 Heartburn: Secondary | ICD-10-CM | POA: Diagnosis not present

## 2018-02-12 DIAGNOSIS — Z79891 Long term (current) use of opiate analgesic: Secondary | ICD-10-CM | POA: Diagnosis not present

## 2018-02-12 DIAGNOSIS — R12 Heartburn: Secondary | ICD-10-CM | POA: Diagnosis not present

## 2018-02-12 DIAGNOSIS — M47816 Spondylosis without myelopathy or radiculopathy, lumbar region: Secondary | ICD-10-CM | POA: Diagnosis not present

## 2018-02-12 DIAGNOSIS — G894 Chronic pain syndrome: Secondary | ICD-10-CM | POA: Diagnosis not present

## 2018-04-09 DIAGNOSIS — Z79891 Long term (current) use of opiate analgesic: Secondary | ICD-10-CM | POA: Diagnosis not present

## 2018-04-09 DIAGNOSIS — R12 Heartburn: Secondary | ICD-10-CM | POA: Diagnosis not present

## 2018-04-09 DIAGNOSIS — G894 Chronic pain syndrome: Secondary | ICD-10-CM | POA: Diagnosis not present

## 2018-04-09 DIAGNOSIS — M47816 Spondylosis without myelopathy or radiculopathy, lumbar region: Secondary | ICD-10-CM | POA: Diagnosis not present

## 2018-06-04 DIAGNOSIS — Z79891 Long term (current) use of opiate analgesic: Secondary | ICD-10-CM | POA: Diagnosis not present

## 2018-06-04 DIAGNOSIS — G894 Chronic pain syndrome: Secondary | ICD-10-CM | POA: Diagnosis not present

## 2018-06-04 DIAGNOSIS — R12 Heartburn: Secondary | ICD-10-CM | POA: Diagnosis not present

## 2018-06-04 DIAGNOSIS — M47816 Spondylosis without myelopathy or radiculopathy, lumbar region: Secondary | ICD-10-CM | POA: Diagnosis not present

## 2018-08-06 DIAGNOSIS — M47816 Spondylosis without myelopathy or radiculopathy, lumbar region: Secondary | ICD-10-CM | POA: Diagnosis not present

## 2018-08-06 DIAGNOSIS — R12 Heartburn: Secondary | ICD-10-CM | POA: Diagnosis not present

## 2018-08-06 DIAGNOSIS — Z79891 Long term (current) use of opiate analgesic: Secondary | ICD-10-CM | POA: Diagnosis not present

## 2018-08-06 DIAGNOSIS — G894 Chronic pain syndrome: Secondary | ICD-10-CM | POA: Diagnosis not present

## 2018-10-01 DIAGNOSIS — R12 Heartburn: Secondary | ICD-10-CM | POA: Diagnosis not present

## 2018-10-01 DIAGNOSIS — M47816 Spondylosis without myelopathy or radiculopathy, lumbar region: Secondary | ICD-10-CM | POA: Diagnosis not present

## 2018-10-01 DIAGNOSIS — Z79891 Long term (current) use of opiate analgesic: Secondary | ICD-10-CM | POA: Diagnosis not present

## 2018-10-01 DIAGNOSIS — G894 Chronic pain syndrome: Secondary | ICD-10-CM | POA: Diagnosis not present

## 2018-11-26 DIAGNOSIS — M47816 Spondylosis without myelopathy or radiculopathy, lumbar region: Secondary | ICD-10-CM | POA: Diagnosis not present

## 2018-11-26 DIAGNOSIS — G894 Chronic pain syndrome: Secondary | ICD-10-CM | POA: Diagnosis not present

## 2018-11-26 DIAGNOSIS — Z79891 Long term (current) use of opiate analgesic: Secondary | ICD-10-CM | POA: Diagnosis not present

## 2018-11-26 DIAGNOSIS — R12 Heartburn: Secondary | ICD-10-CM | POA: Diagnosis not present

## 2019-01-21 DIAGNOSIS — G894 Chronic pain syndrome: Secondary | ICD-10-CM | POA: Diagnosis not present

## 2019-01-21 DIAGNOSIS — R12 Heartburn: Secondary | ICD-10-CM | POA: Diagnosis not present

## 2019-01-21 DIAGNOSIS — Z79891 Long term (current) use of opiate analgesic: Secondary | ICD-10-CM | POA: Diagnosis not present

## 2019-01-21 DIAGNOSIS — M47816 Spondylosis without myelopathy or radiculopathy, lumbar region: Secondary | ICD-10-CM | POA: Diagnosis not present

## 2019-03-18 DIAGNOSIS — R12 Heartburn: Secondary | ICD-10-CM | POA: Diagnosis not present

## 2019-03-18 DIAGNOSIS — G894 Chronic pain syndrome: Secondary | ICD-10-CM | POA: Diagnosis not present

## 2019-03-18 DIAGNOSIS — Z79891 Long term (current) use of opiate analgesic: Secondary | ICD-10-CM | POA: Diagnosis not present

## 2019-03-18 DIAGNOSIS — M47816 Spondylosis without myelopathy or radiculopathy, lumbar region: Secondary | ICD-10-CM | POA: Diagnosis not present

## 2019-05-13 DIAGNOSIS — Z79891 Long term (current) use of opiate analgesic: Secondary | ICD-10-CM | POA: Diagnosis not present

## 2019-05-13 DIAGNOSIS — R12 Heartburn: Secondary | ICD-10-CM | POA: Diagnosis not present

## 2019-05-13 DIAGNOSIS — M47816 Spondylosis without myelopathy or radiculopathy, lumbar region: Secondary | ICD-10-CM | POA: Diagnosis not present

## 2019-05-13 DIAGNOSIS — G894 Chronic pain syndrome: Secondary | ICD-10-CM | POA: Diagnosis not present

## 2019-05-22 ENCOUNTER — Other Ambulatory Visit: Payer: Self-pay | Admitting: Family Medicine

## 2019-05-22 DIAGNOSIS — Z1231 Encounter for screening mammogram for malignant neoplasm of breast: Secondary | ICD-10-CM

## 2019-07-08 DIAGNOSIS — R12 Heartburn: Secondary | ICD-10-CM | POA: Diagnosis not present

## 2019-07-08 DIAGNOSIS — M47816 Spondylosis without myelopathy or radiculopathy, lumbar region: Secondary | ICD-10-CM | POA: Diagnosis not present

## 2019-07-08 DIAGNOSIS — G894 Chronic pain syndrome: Secondary | ICD-10-CM | POA: Diagnosis not present

## 2019-07-08 DIAGNOSIS — Z79891 Long term (current) use of opiate analgesic: Secondary | ICD-10-CM | POA: Diagnosis not present

## 2019-09-02 DIAGNOSIS — M47816 Spondylosis without myelopathy or radiculopathy, lumbar region: Secondary | ICD-10-CM | POA: Diagnosis not present

## 2019-09-02 DIAGNOSIS — G894 Chronic pain syndrome: Secondary | ICD-10-CM | POA: Diagnosis not present

## 2019-09-02 DIAGNOSIS — R12 Heartburn: Secondary | ICD-10-CM | POA: Diagnosis not present

## 2019-09-02 DIAGNOSIS — Z79891 Long term (current) use of opiate analgesic: Secondary | ICD-10-CM | POA: Diagnosis not present

## 2019-10-28 DIAGNOSIS — G894 Chronic pain syndrome: Secondary | ICD-10-CM | POA: Diagnosis not present

## 2019-10-28 DIAGNOSIS — Z79891 Long term (current) use of opiate analgesic: Secondary | ICD-10-CM | POA: Diagnosis not present

## 2019-10-28 DIAGNOSIS — R12 Heartburn: Secondary | ICD-10-CM | POA: Diagnosis not present

## 2019-10-28 DIAGNOSIS — M47816 Spondylosis without myelopathy or radiculopathy, lumbar region: Secondary | ICD-10-CM | POA: Diagnosis not present

## 2019-12-23 DIAGNOSIS — G894 Chronic pain syndrome: Secondary | ICD-10-CM | POA: Diagnosis not present

## 2019-12-23 DIAGNOSIS — R12 Heartburn: Secondary | ICD-10-CM | POA: Diagnosis not present

## 2019-12-23 DIAGNOSIS — M47816 Spondylosis without myelopathy or radiculopathy, lumbar region: Secondary | ICD-10-CM | POA: Diagnosis not present

## 2019-12-23 DIAGNOSIS — Z79891 Long term (current) use of opiate analgesic: Secondary | ICD-10-CM | POA: Diagnosis not present

## 2020-02-17 DIAGNOSIS — R12 Heartburn: Secondary | ICD-10-CM | POA: Diagnosis not present

## 2020-02-17 DIAGNOSIS — G894 Chronic pain syndrome: Secondary | ICD-10-CM | POA: Diagnosis not present

## 2020-02-17 DIAGNOSIS — M47816 Spondylosis without myelopathy or radiculopathy, lumbar region: Secondary | ICD-10-CM | POA: Diagnosis not present

## 2020-02-17 DIAGNOSIS — Z79891 Long term (current) use of opiate analgesic: Secondary | ICD-10-CM | POA: Diagnosis not present

## 2020-04-13 DIAGNOSIS — Z79891 Long term (current) use of opiate analgesic: Secondary | ICD-10-CM | POA: Diagnosis not present

## 2020-04-13 DIAGNOSIS — R12 Heartburn: Secondary | ICD-10-CM | POA: Diagnosis not present

## 2020-04-13 DIAGNOSIS — M47816 Spondylosis without myelopathy or radiculopathy, lumbar region: Secondary | ICD-10-CM | POA: Diagnosis not present

## 2020-04-13 DIAGNOSIS — G894 Chronic pain syndrome: Secondary | ICD-10-CM | POA: Diagnosis not present

## 2020-06-09 DIAGNOSIS — R12 Heartburn: Secondary | ICD-10-CM | POA: Diagnosis not present

## 2020-06-09 DIAGNOSIS — G894 Chronic pain syndrome: Secondary | ICD-10-CM | POA: Diagnosis not present

## 2020-06-09 DIAGNOSIS — Z79891 Long term (current) use of opiate analgesic: Secondary | ICD-10-CM | POA: Diagnosis not present

## 2020-06-09 DIAGNOSIS — M47816 Spondylosis without myelopathy or radiculopathy, lumbar region: Secondary | ICD-10-CM | POA: Diagnosis not present

## 2020-08-04 DIAGNOSIS — Z79891 Long term (current) use of opiate analgesic: Secondary | ICD-10-CM | POA: Diagnosis not present

## 2020-08-04 DIAGNOSIS — M47816 Spondylosis without myelopathy or radiculopathy, lumbar region: Secondary | ICD-10-CM | POA: Diagnosis not present

## 2020-08-04 DIAGNOSIS — R12 Heartburn: Secondary | ICD-10-CM | POA: Diagnosis not present

## 2020-08-04 DIAGNOSIS — G894 Chronic pain syndrome: Secondary | ICD-10-CM | POA: Diagnosis not present

## 2020-09-29 DIAGNOSIS — G894 Chronic pain syndrome: Secondary | ICD-10-CM | POA: Diagnosis not present

## 2020-09-29 DIAGNOSIS — Z79891 Long term (current) use of opiate analgesic: Secondary | ICD-10-CM | POA: Diagnosis not present

## 2020-09-29 DIAGNOSIS — M47816 Spondylosis without myelopathy or radiculopathy, lumbar region: Secondary | ICD-10-CM | POA: Diagnosis not present

## 2020-09-29 DIAGNOSIS — R12 Heartburn: Secondary | ICD-10-CM | POA: Diagnosis not present

## 2020-11-24 DIAGNOSIS — R12 Heartburn: Secondary | ICD-10-CM | POA: Diagnosis not present

## 2020-11-24 DIAGNOSIS — M47816 Spondylosis without myelopathy or radiculopathy, lumbar region: Secondary | ICD-10-CM | POA: Diagnosis not present

## 2020-11-24 DIAGNOSIS — G894 Chronic pain syndrome: Secondary | ICD-10-CM | POA: Diagnosis not present

## 2020-11-24 DIAGNOSIS — Z79891 Long term (current) use of opiate analgesic: Secondary | ICD-10-CM | POA: Diagnosis not present

## 2021-01-19 DIAGNOSIS — R12 Heartburn: Secondary | ICD-10-CM | POA: Diagnosis not present

## 2021-01-19 DIAGNOSIS — Z79891 Long term (current) use of opiate analgesic: Secondary | ICD-10-CM | POA: Diagnosis not present

## 2021-01-19 DIAGNOSIS — M47816 Spondylosis without myelopathy or radiculopathy, lumbar region: Secondary | ICD-10-CM | POA: Diagnosis not present

## 2021-01-19 DIAGNOSIS — G894 Chronic pain syndrome: Secondary | ICD-10-CM | POA: Diagnosis not present

## 2021-03-16 DIAGNOSIS — R12 Heartburn: Secondary | ICD-10-CM | POA: Diagnosis not present

## 2021-03-16 DIAGNOSIS — Z79891 Long term (current) use of opiate analgesic: Secondary | ICD-10-CM | POA: Diagnosis not present

## 2021-03-16 DIAGNOSIS — G894 Chronic pain syndrome: Secondary | ICD-10-CM | POA: Diagnosis not present

## 2021-03-16 DIAGNOSIS — M47816 Spondylosis without myelopathy or radiculopathy, lumbar region: Secondary | ICD-10-CM | POA: Diagnosis not present

## 2021-05-11 DIAGNOSIS — G894 Chronic pain syndrome: Secondary | ICD-10-CM | POA: Diagnosis not present

## 2021-05-11 DIAGNOSIS — Z79891 Long term (current) use of opiate analgesic: Secondary | ICD-10-CM | POA: Diagnosis not present

## 2021-05-11 DIAGNOSIS — R12 Heartburn: Secondary | ICD-10-CM | POA: Diagnosis not present

## 2021-05-11 DIAGNOSIS — M47816 Spondylosis without myelopathy or radiculopathy, lumbar region: Secondary | ICD-10-CM | POA: Diagnosis not present

## 2021-07-06 DIAGNOSIS — M47816 Spondylosis without myelopathy or radiculopathy, lumbar region: Secondary | ICD-10-CM | POA: Diagnosis not present

## 2021-07-06 DIAGNOSIS — G894 Chronic pain syndrome: Secondary | ICD-10-CM | POA: Diagnosis not present

## 2021-07-06 DIAGNOSIS — Z79891 Long term (current) use of opiate analgesic: Secondary | ICD-10-CM | POA: Diagnosis not present

## 2021-07-06 DIAGNOSIS — R12 Heartburn: Secondary | ICD-10-CM | POA: Diagnosis not present

## 2021-08-31 DIAGNOSIS — G894 Chronic pain syndrome: Secondary | ICD-10-CM | POA: Diagnosis not present

## 2021-08-31 DIAGNOSIS — M47816 Spondylosis without myelopathy or radiculopathy, lumbar region: Secondary | ICD-10-CM | POA: Diagnosis not present

## 2021-08-31 DIAGNOSIS — Z79891 Long term (current) use of opiate analgesic: Secondary | ICD-10-CM | POA: Diagnosis not present

## 2021-08-31 DIAGNOSIS — R12 Heartburn: Secondary | ICD-10-CM | POA: Diagnosis not present

## 2021-10-26 DIAGNOSIS — Z79891 Long term (current) use of opiate analgesic: Secondary | ICD-10-CM | POA: Diagnosis not present

## 2021-10-26 DIAGNOSIS — G894 Chronic pain syndrome: Secondary | ICD-10-CM | POA: Diagnosis not present

## 2021-10-26 DIAGNOSIS — R12 Heartburn: Secondary | ICD-10-CM | POA: Diagnosis not present

## 2021-10-26 DIAGNOSIS — M47816 Spondylosis without myelopathy or radiculopathy, lumbar region: Secondary | ICD-10-CM | POA: Diagnosis not present

## 2021-12-21 DIAGNOSIS — M47816 Spondylosis without myelopathy or radiculopathy, lumbar region: Secondary | ICD-10-CM | POA: Diagnosis not present

## 2021-12-21 DIAGNOSIS — R12 Heartburn: Secondary | ICD-10-CM | POA: Diagnosis not present

## 2021-12-21 DIAGNOSIS — Z79891 Long term (current) use of opiate analgesic: Secondary | ICD-10-CM | POA: Diagnosis not present

## 2021-12-21 DIAGNOSIS — G894 Chronic pain syndrome: Secondary | ICD-10-CM | POA: Diagnosis not present

## 2022-02-15 DIAGNOSIS — G894 Chronic pain syndrome: Secondary | ICD-10-CM | POA: Diagnosis not present

## 2022-02-15 DIAGNOSIS — M47816 Spondylosis without myelopathy or radiculopathy, lumbar region: Secondary | ICD-10-CM | POA: Diagnosis not present

## 2022-02-15 DIAGNOSIS — R12 Heartburn: Secondary | ICD-10-CM | POA: Diagnosis not present

## 2022-02-15 DIAGNOSIS — Z79891 Long term (current) use of opiate analgesic: Secondary | ICD-10-CM | POA: Diagnosis not present

## 2022-04-12 DIAGNOSIS — M47816 Spondylosis without myelopathy or radiculopathy, lumbar region: Secondary | ICD-10-CM | POA: Diagnosis not present

## 2022-04-12 DIAGNOSIS — Z79891 Long term (current) use of opiate analgesic: Secondary | ICD-10-CM | POA: Diagnosis not present

## 2022-04-12 DIAGNOSIS — G894 Chronic pain syndrome: Secondary | ICD-10-CM | POA: Diagnosis not present

## 2022-04-12 DIAGNOSIS — R12 Heartburn: Secondary | ICD-10-CM | POA: Diagnosis not present

## 2022-06-14 DIAGNOSIS — Z79891 Long term (current) use of opiate analgesic: Secondary | ICD-10-CM | POA: Diagnosis not present

## 2022-06-14 DIAGNOSIS — M47816 Spondylosis without myelopathy or radiculopathy, lumbar region: Secondary | ICD-10-CM | POA: Diagnosis not present

## 2022-06-14 DIAGNOSIS — G894 Chronic pain syndrome: Secondary | ICD-10-CM | POA: Diagnosis not present

## 2022-06-14 DIAGNOSIS — R12 Heartburn: Secondary | ICD-10-CM | POA: Diagnosis not present

## 2022-08-09 DIAGNOSIS — Z79891 Long term (current) use of opiate analgesic: Secondary | ICD-10-CM | POA: Diagnosis not present

## 2022-08-09 DIAGNOSIS — G894 Chronic pain syndrome: Secondary | ICD-10-CM | POA: Diagnosis not present

## 2022-08-09 DIAGNOSIS — R12 Heartburn: Secondary | ICD-10-CM | POA: Diagnosis not present

## 2022-08-09 DIAGNOSIS — M47816 Spondylosis without myelopathy or radiculopathy, lumbar region: Secondary | ICD-10-CM | POA: Diagnosis not present

## 2022-10-04 DIAGNOSIS — G894 Chronic pain syndrome: Secondary | ICD-10-CM | POA: Diagnosis not present

## 2022-10-04 DIAGNOSIS — R12 Heartburn: Secondary | ICD-10-CM | POA: Diagnosis not present

## 2022-10-04 DIAGNOSIS — M47816 Spondylosis without myelopathy or radiculopathy, lumbar region: Secondary | ICD-10-CM | POA: Diagnosis not present

## 2022-10-04 DIAGNOSIS — Z79891 Long term (current) use of opiate analgesic: Secondary | ICD-10-CM | POA: Diagnosis not present

## 2022-11-30 DIAGNOSIS — Z79891 Long term (current) use of opiate analgesic: Secondary | ICD-10-CM | POA: Diagnosis not present

## 2022-11-30 DIAGNOSIS — G894 Chronic pain syndrome: Secondary | ICD-10-CM | POA: Diagnosis not present

## 2022-11-30 DIAGNOSIS — R12 Heartburn: Secondary | ICD-10-CM | POA: Diagnosis not present

## 2022-11-30 DIAGNOSIS — M47816 Spondylosis without myelopathy or radiculopathy, lumbar region: Secondary | ICD-10-CM | POA: Diagnosis not present

## 2023-01-31 DIAGNOSIS — M47816 Spondylosis without myelopathy or radiculopathy, lumbar region: Secondary | ICD-10-CM | POA: Diagnosis not present

## 2023-01-31 DIAGNOSIS — Z79891 Long term (current) use of opiate analgesic: Secondary | ICD-10-CM | POA: Diagnosis not present

## 2023-01-31 DIAGNOSIS — R12 Heartburn: Secondary | ICD-10-CM | POA: Diagnosis not present

## 2023-01-31 DIAGNOSIS — G894 Chronic pain syndrome: Secondary | ICD-10-CM | POA: Diagnosis not present

## 2023-03-22 DIAGNOSIS — T887XXA Unspecified adverse effect of drug or medicament, initial encounter: Secondary | ICD-10-CM | POA: Diagnosis not present

## 2023-03-22 DIAGNOSIS — T50904A Poisoning by unspecified drugs, medicaments and biological substances, undetermined, initial encounter: Secondary | ICD-10-CM | POA: Diagnosis not present

## 2023-03-22 DIAGNOSIS — R404 Transient alteration of awareness: Secondary | ICD-10-CM | POA: Diagnosis not present

## 2023-03-22 DIAGNOSIS — R55 Syncope and collapse: Secondary | ICD-10-CM | POA: Diagnosis not present
